# Patient Record
Sex: Male | Born: 1935 | Hispanic: Yes | Marital: Married | Smoking: Former smoker
Health system: Southern US, Community
[De-identification: ages and names within clinical notes are randomized; demographics above are authoritative.]

## PROBLEM LIST (undated history)

## (undated) DIAGNOSIS — G8929 Other chronic pain: Secondary | ICD-10-CM

## (undated) DIAGNOSIS — E785 Hyperlipidemia, unspecified: Secondary | ICD-10-CM

## (undated) DIAGNOSIS — R519 Headache, unspecified: Secondary | ICD-10-CM

## (undated) DIAGNOSIS — K219 Gastro-esophageal reflux disease without esophagitis: Secondary | ICD-10-CM

## (undated) DIAGNOSIS — J189 Pneumonia, unspecified organism: Secondary | ICD-10-CM

## (undated) DIAGNOSIS — K279 Peptic ulcer, site unspecified, unspecified as acute or chronic, without hemorrhage or perforation: Secondary | ICD-10-CM

## (undated) DIAGNOSIS — E119 Type 2 diabetes mellitus without complications: Secondary | ICD-10-CM

## (undated) DIAGNOSIS — C801 Malignant (primary) neoplasm, unspecified: Secondary | ICD-10-CM

## (undated) DIAGNOSIS — M1990 Unspecified osteoarthritis, unspecified site: Secondary | ICD-10-CM

## (undated) DIAGNOSIS — I10 Essential (primary) hypertension: Secondary | ICD-10-CM

## (undated) DIAGNOSIS — D649 Anemia, unspecified: Secondary | ICD-10-CM

## (undated) HISTORY — PX: PEG TUBE REMOVAL: SHX2187

## (undated) HISTORY — PX: ABDOMINAL SURGERY: SHX537

## (undated) HISTORY — DX: Anemia, unspecified: D64.9

## (undated) HISTORY — DX: Other chronic pain: G89.29

## (undated) HISTORY — PX: PEG TUBE PLACEMENT: SUR1034

## (undated) HISTORY — PX: INGUINAL HERNIA REPAIR: SUR1180

## (undated) HISTORY — PX: REPAIR OF PERFORATED ULCER: SHX6065

## (undated) HISTORY — DX: Headache, unspecified: R51.9

## (undated) HISTORY — PX: HERNIA REPAIR: SHX51

## (undated) HISTORY — DX: Essential (primary) hypertension: I10

---

## 2005-12-26 DIAGNOSIS — C801 Malignant (primary) neoplasm, unspecified: Secondary | ICD-10-CM

## 2005-12-26 DIAGNOSIS — C159 Malignant neoplasm of esophagus, unspecified: Secondary | ICD-10-CM

## 2005-12-26 HISTORY — DX: Malignant (primary) neoplasm, unspecified: C80.1

## 2005-12-26 HISTORY — DX: Malignant neoplasm of esophagus, unspecified: C15.9

## 2014-02-17 DIAGNOSIS — D491 Neoplasm of unspecified behavior of respiratory system: Secondary | ICD-10-CM | POA: Diagnosis not present

## 2014-02-17 DIAGNOSIS — M62838 Other muscle spasm: Secondary | ICD-10-CM | POA: Diagnosis not present

## 2014-02-17 DIAGNOSIS — K219 Gastro-esophageal reflux disease without esophagitis: Secondary | ICD-10-CM | POA: Diagnosis not present

## 2014-02-19 DIAGNOSIS — M069 Rheumatoid arthritis, unspecified: Secondary | ICD-10-CM | POA: Diagnosis not present

## 2014-02-19 DIAGNOSIS — M2560 Stiffness of unspecified joint, not elsewhere classified: Secondary | ICD-10-CM | POA: Diagnosis not present

## 2014-03-05 DIAGNOSIS — K219 Gastro-esophageal reflux disease without esophagitis: Secondary | ICD-10-CM | POA: Diagnosis not present

## 2014-03-05 DIAGNOSIS — M62838 Other muscle spasm: Secondary | ICD-10-CM | POA: Diagnosis not present

## 2014-03-05 DIAGNOSIS — D491 Neoplasm of unspecified behavior of respiratory system: Secondary | ICD-10-CM | POA: Diagnosis not present

## 2014-03-12 DIAGNOSIS — D491 Neoplasm of unspecified behavior of respiratory system: Secondary | ICD-10-CM | POA: Diagnosis not present

## 2014-03-12 DIAGNOSIS — M62838 Other muscle spasm: Secondary | ICD-10-CM | POA: Diagnosis not present

## 2014-03-12 DIAGNOSIS — K219 Gastro-esophageal reflux disease without esophagitis: Secondary | ICD-10-CM | POA: Diagnosis not present

## 2014-03-18 DIAGNOSIS — M069 Rheumatoid arthritis, unspecified: Secondary | ICD-10-CM | POA: Diagnosis not present

## 2014-04-16 DIAGNOSIS — M069 Rheumatoid arthritis, unspecified: Secondary | ICD-10-CM | POA: Diagnosis not present

## 2014-04-16 DIAGNOSIS — M2560 Stiffness of unspecified joint, not elsewhere classified: Secondary | ICD-10-CM | POA: Diagnosis not present

## 2014-04-29 DIAGNOSIS — M069 Rheumatoid arthritis, unspecified: Secondary | ICD-10-CM | POA: Diagnosis not present

## 2014-04-29 DIAGNOSIS — M353 Polymyalgia rheumatica: Secondary | ICD-10-CM | POA: Diagnosis not present

## 2014-04-29 DIAGNOSIS — Z8521 Personal history of malignant neoplasm of larynx: Secondary | ICD-10-CM | POA: Diagnosis not present

## 2014-04-29 DIAGNOSIS — D649 Anemia, unspecified: Secondary | ICD-10-CM | POA: Diagnosis not present

## 2014-05-14 DIAGNOSIS — B9789 Other viral agents as the cause of diseases classified elsewhere: Secondary | ICD-10-CM | POA: Diagnosis not present

## 2014-05-16 DIAGNOSIS — R259 Unspecified abnormal involuntary movements: Secondary | ICD-10-CM | POA: Diagnosis not present

## 2014-05-28 DIAGNOSIS — IMO0001 Reserved for inherently not codable concepts without codable children: Secondary | ICD-10-CM | POA: Diagnosis not present

## 2014-05-28 DIAGNOSIS — M353 Polymyalgia rheumatica: Secondary | ICD-10-CM | POA: Diagnosis not present

## 2014-05-28 DIAGNOSIS — Z8521 Personal history of malignant neoplasm of larynx: Secondary | ICD-10-CM | POA: Diagnosis not present

## 2014-06-17 DIAGNOSIS — C320 Malignant neoplasm of glottis: Secondary | ICD-10-CM | POA: Diagnosis not present

## 2014-06-17 DIAGNOSIS — IMO0002 Reserved for concepts with insufficient information to code with codable children: Secondary | ICD-10-CM | POA: Diagnosis not present

## 2014-06-17 DIAGNOSIS — Z79899 Other long term (current) drug therapy: Secondary | ICD-10-CM | POA: Diagnosis not present

## 2014-06-17 DIAGNOSIS — Z09 Encounter for follow-up examination after completed treatment for conditions other than malignant neoplasm: Secondary | ICD-10-CM | POA: Diagnosis not present

## 2014-06-17 DIAGNOSIS — C760 Malignant neoplasm of head, face and neck: Secondary | ICD-10-CM | POA: Diagnosis not present

## 2014-06-17 DIAGNOSIS — Z8521 Personal history of malignant neoplasm of larynx: Secondary | ICD-10-CM | POA: Diagnosis not present

## 2014-06-17 DIAGNOSIS — Z923 Personal history of irradiation: Secondary | ICD-10-CM | POA: Diagnosis not present

## 2014-06-25 DIAGNOSIS — Z8521 Personal history of malignant neoplasm of larynx: Secondary | ICD-10-CM | POA: Diagnosis not present

## 2014-06-25 DIAGNOSIS — C321 Malignant neoplasm of supraglottis: Secondary | ICD-10-CM | POA: Diagnosis not present

## 2014-08-19 DIAGNOSIS — IMO0002 Reserved for concepts with insufficient information to code with codable children: Secondary | ICD-10-CM | POA: Diagnosis not present

## 2014-08-19 DIAGNOSIS — Z8521 Personal history of malignant neoplasm of larynx: Secondary | ICD-10-CM | POA: Diagnosis not present

## 2014-08-19 DIAGNOSIS — N138 Other obstructive and reflux uropathy: Secondary | ICD-10-CM | POA: Diagnosis not present

## 2014-08-19 DIAGNOSIS — E782 Mixed hyperlipidemia: Secondary | ICD-10-CM | POA: Diagnosis not present

## 2014-08-19 DIAGNOSIS — E1169 Type 2 diabetes mellitus with other specified complication: Secondary | ICD-10-CM | POA: Diagnosis not present

## 2014-08-19 DIAGNOSIS — M353 Polymyalgia rheumatica: Secondary | ICD-10-CM | POA: Diagnosis not present

## 2014-08-19 DIAGNOSIS — N401 Enlarged prostate with lower urinary tract symptoms: Secondary | ICD-10-CM | POA: Diagnosis not present

## 2014-08-26 DIAGNOSIS — J069 Acute upper respiratory infection, unspecified: Secondary | ICD-10-CM | POA: Diagnosis not present

## 2014-09-02 DIAGNOSIS — M069 Rheumatoid arthritis, unspecified: Secondary | ICD-10-CM | POA: Diagnosis not present

## 2014-09-04 DIAGNOSIS — E1169 Type 2 diabetes mellitus with other specified complication: Secondary | ICD-10-CM | POA: Diagnosis not present

## 2014-09-04 DIAGNOSIS — E782 Mixed hyperlipidemia: Secondary | ICD-10-CM | POA: Diagnosis not present

## 2014-09-04 DIAGNOSIS — C19 Malignant neoplasm of rectosigmoid junction: Secondary | ICD-10-CM | POA: Diagnosis not present

## 2014-09-04 DIAGNOSIS — M069 Rheumatoid arthritis, unspecified: Secondary | ICD-10-CM | POA: Diagnosis not present

## 2014-09-04 DIAGNOSIS — M2550 Pain in unspecified joint: Secondary | ICD-10-CM | POA: Diagnosis not present

## 2014-09-04 DIAGNOSIS — M329 Systemic lupus erythematosus, unspecified: Secondary | ICD-10-CM | POA: Diagnosis not present

## 2014-09-04 DIAGNOSIS — E039 Hypothyroidism, unspecified: Secondary | ICD-10-CM | POA: Diagnosis not present

## 2014-09-15 DIAGNOSIS — M069 Rheumatoid arthritis, unspecified: Secondary | ICD-10-CM | POA: Diagnosis not present

## 2014-09-15 DIAGNOSIS — M2550 Pain in unspecified joint: Secondary | ICD-10-CM | POA: Diagnosis not present

## 2014-09-18 DIAGNOSIS — R141 Gas pain: Secondary | ICD-10-CM | POA: Diagnosis not present

## 2014-09-18 DIAGNOSIS — R198 Other specified symptoms and signs involving the digestive system and abdomen: Secondary | ICD-10-CM | POA: Diagnosis not present

## 2014-10-01 DIAGNOSIS — E1165 Type 2 diabetes mellitus with hyperglycemia: Secondary | ICD-10-CM | POA: Diagnosis not present

## 2014-10-01 DIAGNOSIS — M069 Rheumatoid arthritis, unspecified: Secondary | ICD-10-CM | POA: Diagnosis not present

## 2014-10-01 DIAGNOSIS — D638 Anemia in other chronic diseases classified elsewhere: Secondary | ICD-10-CM | POA: Diagnosis not present

## 2014-10-01 DIAGNOSIS — N400 Enlarged prostate without lower urinary tract symptoms: Secondary | ICD-10-CM | POA: Diagnosis not present

## 2014-10-02 DIAGNOSIS — M7662 Achilles tendinitis, left leg: Secondary | ICD-10-CM | POA: Diagnosis not present

## 2014-10-02 DIAGNOSIS — B351 Tinea unguium: Secondary | ICD-10-CM | POA: Diagnosis not present

## 2014-10-02 DIAGNOSIS — E1142 Type 2 diabetes mellitus with diabetic polyneuropathy: Secondary | ICD-10-CM | POA: Diagnosis not present

## 2014-10-02 DIAGNOSIS — M79671 Pain in right foot: Secondary | ICD-10-CM | POA: Diagnosis not present

## 2014-10-07 DIAGNOSIS — M79671 Pain in right foot: Secondary | ICD-10-CM | POA: Diagnosis not present

## 2014-10-16 DIAGNOSIS — M79671 Pain in right foot: Secondary | ICD-10-CM | POA: Diagnosis not present

## 2014-10-16 DIAGNOSIS — M779 Enthesopathy, unspecified: Secondary | ICD-10-CM | POA: Diagnosis not present

## 2014-10-16 DIAGNOSIS — E1142 Type 2 diabetes mellitus with diabetic polyneuropathy: Secondary | ICD-10-CM | POA: Diagnosis not present

## 2014-11-03 DIAGNOSIS — M353 Polymyalgia rheumatica: Secondary | ICD-10-CM | POA: Diagnosis not present

## 2014-11-03 DIAGNOSIS — M0609 Rheumatoid arthritis without rheumatoid factor, multiple sites: Secondary | ICD-10-CM | POA: Diagnosis not present

## 2015-01-22 DIAGNOSIS — E119 Type 2 diabetes mellitus without complications: Secondary | ICD-10-CM | POA: Diagnosis not present

## 2015-01-22 DIAGNOSIS — N400 Enlarged prostate without lower urinary tract symptoms: Secondary | ICD-10-CM | POA: Diagnosis not present

## 2015-01-22 DIAGNOSIS — D649 Anemia, unspecified: Secondary | ICD-10-CM | POA: Diagnosis not present

## 2015-01-22 DIAGNOSIS — E785 Hyperlipidemia, unspecified: Secondary | ICD-10-CM | POA: Diagnosis not present

## 2015-01-27 DIAGNOSIS — I340 Nonrheumatic mitral (valve) insufficiency: Secondary | ICD-10-CM | POA: Diagnosis not present

## 2015-01-27 DIAGNOSIS — R0989 Other specified symptoms and signs involving the circulatory and respiratory systems: Secondary | ICD-10-CM | POA: Diagnosis not present

## 2015-01-27 DIAGNOSIS — R9431 Abnormal electrocardiogram [ECG] [EKG]: Secondary | ICD-10-CM | POA: Diagnosis not present

## 2015-01-27 DIAGNOSIS — G458 Other transient cerebral ischemic attacks and related syndromes: Secondary | ICD-10-CM | POA: Diagnosis not present

## 2015-03-06 DIAGNOSIS — J019 Acute sinusitis, unspecified: Secondary | ICD-10-CM | POA: Diagnosis not present

## 2015-03-10 DIAGNOSIS — Z6824 Body mass index (BMI) 24.0-24.9, adult: Secondary | ICD-10-CM | POA: Diagnosis not present

## 2015-03-10 DIAGNOSIS — D638 Anemia in other chronic diseases classified elsewhere: Secondary | ICD-10-CM | POA: Diagnosis not present

## 2015-03-10 DIAGNOSIS — N401 Enlarged prostate with lower urinary tract symptoms: Secondary | ICD-10-CM | POA: Diagnosis not present

## 2015-03-10 DIAGNOSIS — Z8521 Personal history of malignant neoplasm of larynx: Secondary | ICD-10-CM | POA: Diagnosis not present

## 2015-03-10 DIAGNOSIS — M353 Polymyalgia rheumatica: Secondary | ICD-10-CM | POA: Diagnosis not present

## 2015-03-10 DIAGNOSIS — Z87891 Personal history of nicotine dependence: Secondary | ICD-10-CM | POA: Diagnosis not present

## 2015-03-10 DIAGNOSIS — E119 Type 2 diabetes mellitus without complications: Secondary | ICD-10-CM | POA: Diagnosis not present

## 2015-03-25 DIAGNOSIS — M0609 Rheumatoid arthritis without rheumatoid factor, multiple sites: Secondary | ICD-10-CM | POA: Diagnosis not present

## 2015-03-26 DIAGNOSIS — M069 Rheumatoid arthritis, unspecified: Secondary | ICD-10-CM | POA: Diagnosis not present

## 2015-03-26 DIAGNOSIS — R079 Chest pain, unspecified: Secondary | ICD-10-CM | POA: Diagnosis not present

## 2015-03-26 DIAGNOSIS — B159 Hepatitis A without hepatic coma: Secondary | ICD-10-CM | POA: Diagnosis not present

## 2015-05-04 DIAGNOSIS — Z87891 Personal history of nicotine dependence: Secondary | ICD-10-CM | POA: Diagnosis not present

## 2015-05-04 DIAGNOSIS — E119 Type 2 diabetes mellitus without complications: Secondary | ICD-10-CM | POA: Diagnosis not present

## 2015-05-04 DIAGNOSIS — Z8521 Personal history of malignant neoplasm of larynx: Secondary | ICD-10-CM | POA: Diagnosis not present

## 2015-05-04 DIAGNOSIS — Z6824 Body mass index (BMI) 24.0-24.9, adult: Secondary | ICD-10-CM | POA: Diagnosis not present

## 2015-05-04 DIAGNOSIS — E782 Mixed hyperlipidemia: Secondary | ICD-10-CM | POA: Diagnosis not present

## 2015-05-04 DIAGNOSIS — N401 Enlarged prostate with lower urinary tract symptoms: Secondary | ICD-10-CM | POA: Diagnosis not present

## 2015-05-04 DIAGNOSIS — D638 Anemia in other chronic diseases classified elsewhere: Secondary | ICD-10-CM | POA: Diagnosis not present

## 2015-07-09 DIAGNOSIS — M542 Cervicalgia: Secondary | ICD-10-CM | POA: Diagnosis not present

## 2015-07-16 DIAGNOSIS — M542 Cervicalgia: Secondary | ICD-10-CM | POA: Diagnosis not present

## 2015-07-16 DIAGNOSIS — R29898 Other symptoms and signs involving the musculoskeletal system: Secondary | ICD-10-CM | POA: Diagnosis not present

## 2015-08-24 DIAGNOSIS — C321 Malignant neoplasm of supraglottis: Secondary | ICD-10-CM | POA: Diagnosis not present

## 2015-08-24 DIAGNOSIS — Z8521 Personal history of malignant neoplasm of larynx: Secondary | ICD-10-CM | POA: Diagnosis not present

## 2015-09-02 DIAGNOSIS — E119 Type 2 diabetes mellitus without complications: Secondary | ICD-10-CM | POA: Diagnosis not present

## 2015-09-02 DIAGNOSIS — C321 Malignant neoplasm of supraglottis: Secondary | ICD-10-CM | POA: Diagnosis not present

## 2015-09-02 DIAGNOSIS — K5790 Diverticulosis of intestine, part unspecified, without perforation or abscess without bleeding: Secondary | ICD-10-CM | POA: Diagnosis not present

## 2015-09-02 DIAGNOSIS — I2510 Atherosclerotic heart disease of native coronary artery without angina pectoris: Secondary | ICD-10-CM | POA: Diagnosis not present

## 2015-09-02 DIAGNOSIS — R490 Dysphonia: Secondary | ICD-10-CM | POA: Diagnosis not present

## 2015-09-02 DIAGNOSIS — Z8521 Personal history of malignant neoplasm of larynx: Secondary | ICD-10-CM | POA: Diagnosis not present

## 2015-09-02 DIAGNOSIS — J449 Chronic obstructive pulmonary disease, unspecified: Secondary | ICD-10-CM | POA: Diagnosis not present

## 2015-09-02 DIAGNOSIS — Z08 Encounter for follow-up examination after completed treatment for malignant neoplasm: Secondary | ICD-10-CM | POA: Diagnosis not present

## 2015-09-02 DIAGNOSIS — Z79899 Other long term (current) drug therapy: Secondary | ICD-10-CM | POA: Diagnosis not present

## 2015-11-21 ENCOUNTER — Encounter (HOSPITAL_COMMUNITY): Payer: Self-pay | Admitting: Emergency Medicine

## 2015-11-21 ENCOUNTER — Emergency Department (HOSPITAL_COMMUNITY)
Admission: EM | Admit: 2015-11-21 | Discharge: 2015-11-21 | Disposition: A | Discharge status: Home / Self Care | Payer: Medicare Other | Attending: Emergency Medicine | Admitting: Emergency Medicine

## 2015-11-21 ENCOUNTER — Emergency Department (HOSPITAL_COMMUNITY): Payer: Medicare Other

## 2015-11-21 DIAGNOSIS — Z85818 Personal history of malignant neoplasm of other sites of lip, oral cavity, and pharynx: Secondary | ICD-10-CM | POA: Insufficient documentation

## 2015-11-21 DIAGNOSIS — E119 Type 2 diabetes mellitus without complications: Secondary | ICD-10-CM | POA: Diagnosis not present

## 2015-11-21 DIAGNOSIS — M47816 Spondylosis without myelopathy or radiculopathy, lumbar region: Secondary | ICD-10-CM | POA: Diagnosis not present

## 2015-11-21 DIAGNOSIS — M1990 Unspecified osteoarthritis, unspecified site: Secondary | ICD-10-CM | POA: Diagnosis not present

## 2015-11-21 DIAGNOSIS — E785 Hyperlipidemia, unspecified: Secondary | ICD-10-CM | POA: Insufficient documentation

## 2015-11-21 DIAGNOSIS — Z79899 Other long term (current) drug therapy: Secondary | ICD-10-CM | POA: Insufficient documentation

## 2015-11-21 DIAGNOSIS — M545 Low back pain: Secondary | ICD-10-CM | POA: Diagnosis not present

## 2015-11-21 DIAGNOSIS — Z8719 Personal history of other diseases of the digestive system: Secondary | ICD-10-CM | POA: Diagnosis not present

## 2015-11-21 HISTORY — DX: Malignant (primary) neoplasm, unspecified: C80.1

## 2015-11-21 HISTORY — DX: Type 2 diabetes mellitus without complications: E11.9

## 2015-11-21 HISTORY — DX: Unspecified osteoarthritis, unspecified site: M19.90

## 2015-11-21 HISTORY — DX: Gastro-esophageal reflux disease without esophagitis: K21.9

## 2015-11-21 HISTORY — DX: Hyperlipidemia, unspecified: E78.5

## 2015-11-21 LAB — I-STAT CREATININE, ED: Creatinine, Ser: 1.1 mg/dL (ref 0.61–1.24)

## 2015-11-21 LAB — URINALYSIS, ROUTINE W REFLEX MICROSCOPIC
Bilirubin Urine: NEGATIVE
Glucose, UA: 250 mg/dL — AB
Hgb urine dipstick: NEGATIVE
Ketones, ur: NEGATIVE mg/dL
Leukocytes, UA: NEGATIVE
Nitrite: NEGATIVE
Protein, ur: NEGATIVE mg/dL
Specific Gravity, Urine: 1.011 (ref 1.005–1.030)
pH: 5.5 (ref 5.0–8.0)

## 2015-11-21 MED ORDER — TRAMADOL HCL 50 MG PO TABS
50.0000 mg | ORAL_TABLET | Freq: Once | ORAL | Status: AC
  Administered 2015-11-21: 50 mg via ORAL
  Filled 2015-11-21: qty 1

## 2015-11-21 MED ORDER — TRAMADOL HCL 50 MG PO TABS: 50.0000 mg | ORAL_TABLET | Freq: Four times a day (QID) | ORAL | Status: DC | PRN

## 2015-11-21 NOTE — ED Notes (Signed)
Patient c/o low back pain. Has been going on for about 2 months. Patient is bilateral to both sides, it is uncomfortable to sit. Patient daughter states the patient has a hx of bad arthritis. Patient says that he wants to rule out a problem with his kidneys.

## 2015-11-21 NOTE — ED Provider Notes (Signed)
CSN: [id number]     Arrival date & time 11/21/15  0757 History   First MD Initiated Contact with Patient 11/21/15 [phone]     Chief Complaint  Patient presents with  . Back Pain     (Consider location/radiation/quality/duration/timing/severity/associated sxs/prior Treatment) HPI   81 year old male with history of non-insulin-dependent diabetes, arthritis, throat cancer, GERD presenting for evaluation of low back pain. Patient is accompanied by his daughter and son at their request to have evaluation of his back. Patient mentioned he has had recurrent pain to his lower back and bilateral hip ongoing for the past 2 months. He described pain as a sharp sensation, worsening at nighttime and in the morning. Pain is moderate in intensity, nonradiating, without any associated fever, chills, bowel bladder incontinence, or saddle anesthesia. He has not noticed any rash. No complaints of chest pain abdominal pain, dysuria, hematuria, focal numbness or weakness. When asked if he has notice any abnormal weight changes, daughter noticed that he has been losing weight despite having a healthy appetite. He does have a remote history of throat cancer status post chemotherapy and radiation in 2007, currently in remission. He has been taking Tylenol for his pain with our adequate relief. He has not been seen by his PCP for this complaint. He does have history of arthritis. Daughter also request for his kidney function to be checked despite patient not having any significant known kidney disease. No history of IV drug use or active cancer.  Past Medical History  Diagnosis Date  . Arthritis   . Cancer ([place]) 2007    throat  . Diabetes mellitus without complication ([place])   . Hyperlipidemia   . GERD (gastroesophageal reflux disease)    Past Surgical History  Procedure Laterality Date  . Peg tube placement    . Peg tube removal    . Hernia repair     History reviewed. No pertinent family history. Social History   Substance Use Topics  . Smoking status: Never Smoker   . Smokeless tobacco: None  . Alcohol Use: Yes     Comment: occ    Review of Systems  All other systems reviewed and are negative.     Allergies  Review of patient's allergies indicates no known allergies.  Home Medications   Prior to Admission medications   Medication Sig Start Date End Date Taking? Authorizing Provider  cholecalciferol (VITAMIN D) 1000 UNITS tablet Take 2,000 Units by mouth every morning.   Yes Historical Provider, MD  ferrous sulfate 325 (65 FE) MG tablet Take 325 mg by mouth daily with breakfast.   Yes Historical Provider, MD  metFORMIN (GLUCOPHAGE) 1000 MG tablet Take 1,000 mg by mouth 2 (two) times daily with a meal.   Yes Historical Provider, MD  Multiple Vitamin (MULTIVITAMIN WITH MINERALS) TABS tablet Take 1 tablet by mouth every morning.   Yes Historical Provider, MD  simvastatin (ZOCOR) 80 MG tablet Take 80 mg by mouth at bedtime.   Yes Historical Provider, MD  terazosin (HYTRIN) 2 MG capsule Take 2 mg by mouth at bedtime.   Yes Historical Provider, MD   BP 120/64 mmHg  Pulse 86  Temp(Src) 97.8 F (36.6 C) (Oral)  Resp 14  SpO2 96% Physical Exam  Constitutional: He appears well-developed and well-nourished. No distress.  Healthy-appearing Caucasian male in no acute discomfort.  HENT:  Head: Atraumatic.  Eyes: Conjunctivae are normal.  Neck: Neck supple.  Cardiovascular: Normal rate, regular rhythm and intact distal pulses.   Pulmonary/Chest: Effort  normal and breath sounds normal.  Abdominal: Soft. There is no tenderness.  Abdomen is soft and nontender, no palpable mass or abdominal bruit noted  Genitourinary:  No CVA tenderness  Musculoskeletal: He exhibits no edema.  No significant midline spine tenderness crepitus or step-off. No overlying skin changes. Increasing low back pain with back flexion and extension however maintaining normal range of motion.  Neurological: He is alert.  5  out 5 strength to bilateral lower extremities. Patellar deep tendon reflex intact bilaterally. No foot drop. Normal gait.  Skin: No rash noted.  Psychiatric: He has a normal mood and affect.  Nursing note and vitals reviewed.   ED Course  Procedures (including critical care time)  Patient here with recurrent low back pain for 2 months. Likely arthritis. No suspicion for acute emergent medical condition such as aortic dissection, epidural abscess, kidney stones, UTIs, or other infectious etiology.  11:19 AM Urine shows no evidence of urinary tract infection. X-ray of L-spine with disc disease but no acute finding. Normal renal function. Suspect arthralgia causing his pain. Patient will be discharge with Ultram as needed for pain. Care discussed with Dr. Rex Kras who agrees.  Labs Review Labs Reviewed  URINALYSIS, ROUTINE W REFLEX MICROSCOPIC (NOT AT [facility]) - Abnormal; Notable for the following:    Glucose, UA 250 (*)    All other components within normal limits  I-STAT CREATININE, ED    Imaging Review Dg Lumbar Spine Complete  11/21/2015  CLINICAL DATA:  Low back pain 2 months.  No injury. EXAM: LUMBAR SPINE - COMPLETE 4+ VIEW COMPARISON:  None. FINDINGS: Vertebral body alignment and heights are normal. There is mild spondylosis throughout the lumbar spine. There is mild disc space narrowing at the L5-S1 level. No evidence of compression fracture or subluxation. There is facet arthropathy over the lower lumbar spine. Small bone island over the L4 vertebral body. Calcified plaque over the abdominal aorta and iliac arteries. Surgical clips over the upper abdomen and pelvis. IMPRESSION: Mild spondylosis throughout the lumbar spine with disc disease at the L5-S1 level. No acute findings. Electronically Signed   By: Marin Olp M.D.   On: 11/21/2015 09:23   I have personally reviewed and evaluated these images and lab results as part of my medical decision-making.   EKG Interpretation None       MDM   Final diagnoses:  Low back pain without sciatica, unspecified back pain laterality  Arthritis    BP 168/95 mmHg  Pulse 88  Temp(Src) 97.8 F (36.6 C) (Oral)  Resp 16  Wt 66.679 kg  SpO2 96% The patient was noted to be hypertensive today in the emergency department. I have spoken with the patient regarding hypertension and the need for improved management. I instructed the patient to followup with the Primary care doctor within 4 days to improve the management of the patient's hypertension. I also counseled the patient regarding the signs and symptoms which would require an emergent visit to an emergency department for hypertensive urgency and/or hypertensive emergency. The patient understood the need for improved hypertensive management.     Domenic Moras, PA-C 11/21/15 [place], MD 11/21/15 2056

## 2015-11-21 NOTE — Discharge Instructions (Signed)
Your back pain is likely due to arthritis.  Take Ultram as needed for pain.  Follow instruction below.     Artritis (Arthritis) El trmino artritis se [place] comnmente para hacer referencia al dolor de las articulaciones o a la enfermedad articular. Hay ms de 100tipos de artritis. CAUSAS La causa ms frecuente de esta afeccin es el desgaste de una articulacin. Algunas otras causas son las siguientes:  Gota.  Inflamacin de una articulacin.  Una infeccin de Insurance claims handler.  Esguinces y otras lesiones cerca de la articulacin.  Una reaccin farmacolgica o alrgica. En algunos casos, es posible que la causa no se conozca. SNTOMAS El sntoma principal de esta afeccin es el dolor de la articulacin con el movimiento. Otros sntomas pueden ser los siguientes:  Enrojecimiento, hinchazn o rigidez de Insurance claims handler.  Calor que emana de Water engineer.  Cristy Hilts.  Sensacin generalizada de estar enfermo. DIAGNSTICO Esta afeccin se puede diagnosticar mediante un examen fsico y Arvid Right ellos:  Anlisis de [place].  Anlisis de [place].  Estudios de diagnstico por imgenes, como una resonancia magntica (RM), radiografas o una tomografa computarizada (TC). A veces, se extrae lquido de una articulacin para analizarlo. TRATAMIENTO El tratamiento de esta afeccin puede incluir lo siguiente:  El tratamiento de la causa, si se conoce.  Reposo.  Mantener elevada la articulacin.  Aplicar compresas fras o calientes en la articulacin.  Medicamentos para [employer] sntomas y Risk manager.  Inyecciones de un corticoide, como cortisona, en la articulacin para ayudar a Best boy y Risk manager. Segn la causa de la artritis, tal vez haya que hacer cambios en el estilo de vida para reducir la carga sobre la articulacin. Algunos de los cambios incluyen realizar ms actividad fsica y Sports coach de [place], [place]. Ezel los medicamentos de venta libre y los recetados solamente como se lo haya indicado el mdico.  No tome aspirina para Theatre stage manager dolor si se sospecha la presencia de [place]. Actividades  Ponga en reposo la articulacin como se lo haya indicado el mdico. El reposo es importante cuando la enfermedad est activa y la articulacin le duele, est hinchada o rgida.  Evite las actividades que intensifiquen Conservation officer, historic buildings. Es importante Paramedic equilibrio entre la actividad y el reposo.  Con frecuencia, realice ejercicios de flexibilidad articular como se lo haya indicado el mdico. Intente realizar ejercicios de bajo impacto, por ejemplo:  Natacin.  Gimnasia acutica.  Andar en bicicleta.  Caminar. Cuidado de la articulacin  Si la articulacin se le hincha, mantngala elevada como se lo haya indicado el mdico.  Si al despertar por la maana, nota que la articulacin est rgida, intente tomar una ducha con agua tibia.  Si se lo indican, pngase calor en la articulacin. Si es diabtico, no se aplique calor sin la autorizacin del mdico.  Coloque una toalla entre la articulacin y la compresa caliente o la almohadilla trmica.  Coloque el calor en la zona durante 20 o 67mnutos.  Si se lo indican, pngase hielo en la articulacin:  Ponga el hielo en una bolsa plstica.  Coloque una toalla entre la piel y la bolsa de hielo.  Coloque el hielo durante 257mutos, 2 a 3veces por daTraining and development officer Concurra a todas las visitas de control como se lo haya indicado el mdico. Esto es importante. SOLICITE ATENCIN MDICA SI:  El dolor empeora.  Tiene fiebre. SOLICITE ATENCIN MDICA DE INMEDIATO SI:  Siente  dolor, u observa hinchazn o enrojecimiento en la articulacin.  Siente dolor en muchas articulaciones y se le hinchan.  Siente un dolor intenso en la espalda.  Tiene mucha debilidad en la pierna.  No puede controlar los intestinos o la  vejiga.   Esta informacin no tiene Marine scientist el consejo del mdico. Asegrese de hacerle al mdico cualquier pregunta que tenga.   Document Released: 12/12/2005 Document Revised: 09/02/2015 Elsevier Interactive Patient Education 2016 [place]. Back Pain, Adult Back pain is very common in adults.The cause of back pain is rarely dangerous and the pain often gets better over time.The cause of your back pain may not be known. Some common causes of back pain include:  Strain of the muscles or ligaments supporting the spine.  Wear and tear (degeneration) of the spinal disks.  Arthritis.  Direct injury to the back. For many people, back pain may return. Since back pain is rarely dangerous, most people can learn to manage this condition on their own. HOME CARE INSTRUCTIONS Watch your back pain for any changes. The following actions may help to lessen any discomfort you are feeling:  Remain active. It is stressful on your back to sit or stand in one place for long periods of time. Do not sit, drive, or stand in one place for more than 30 minutes at a time. Take short walks on even surfaces as soon as you are able.Try to increase the length of time you walk each day.  Exercise regularly as directed by your health care provider. Exercise helps your back heal faster. It also helps avoid future injury by keeping your muscles strong and flexible.  Do not stay in bed.Resting more than 1-2 days can delay your recovery.  Pay attention to your body when you bend and lift. The most comfortable positions are those that put less stress on your recovering back. Always use proper lifting techniques, including:  Bending your knees.  Keeping the load close to your body.  Avoiding twisting.  Find a comfortable position to sleep. Use a firm mattress and lie on your side with your knees slightly bent. If you lie on your back, put a pillow under your knees.  Avoid feeling anxious or  stressed.Stress increases muscle tension and can worsen back pain.It is important to recognize when you are anxious or stressed and learn ways to manage it, such as with exercise.  Take medicines only as directed by your health care provider. Over-the-counter medicines to reduce pain and inflammation are often the most helpful.Your health care provider may prescribe muscle relaxant drugs.These medicines help dull your pain so you can more quickly return to your normal activities and healthy exercise.  Apply ice to the injured area:  Put ice in a plastic bag.  Place a towel between your skin and the bag.  Leave the ice on for 20 minutes, 2-3 times a day for the first 2-3 days. After that, ice and heat may be alternated to reduce pain and spasms.  Maintain a healthy weight. Excess weight puts extra stress on your back and makes it difficult to maintain good posture. SEEK MEDICAL CARE IF:  You have pain that is not relieved with rest or medicine.  You have increasing pain going down into the legs or buttocks.  You have pain that does not improve in one week.  You have night pain.  You lose weight.  You have a fever or chills. SEEK IMMEDIATE MEDICAL CARE IF:   You develop  new bowel or bladder control problems.  You have unusual weakness or numbness in your arms or legs.  You develop nausea or vomiting.  You develop abdominal pain.  You feel faint.   This information is not intended to replace advice given to you by your health care provider. Make sure you discuss any questions you have with your health care provider.   Document Released: 12/12/2005 Document Revised: 01/02/2015 Document Reviewed: 04/15/2014 Elsevier Interactive Patient Education 2016 [employer].   Emergency Department Resource Guide 1) Find a Doctor and Pay Out of Pocket Although you won't have to find out who is covered by your insurance plan, it is a good idea to ask around and get recommendations.  You will then need to call the office and see if the doctor you have chosen will accept you as a new patient and what types of options they offer for patients who are self-pay. Some doctors offer discounts or will set up payment plans for their patients who do not have insurance, but you will need to ask so you aren't surprised when you get to your appointment.  2) Contact Your Local Health Department Not all health departments have doctors that can see patients for sick visits, but many do, so it is worth a call to see if yours does. If you don't know where your local health department is, you can check in your phone book. The CDC also has a tool to help you locate your state's health department, and many state websites also have listings of all of their local health departments.  3) Find a [facility] If your illness is not likely to be very severe or complicated, you may want to try a walk in clinic. These are popping up all over the country in pharmacies, drugstores, and shopping centers. They're usually staffed by nurse practitioners or physician assistants that have been trained to treat common illnesses and complaints. They're usually fairly quick and inexpensive. However, if you have serious medical issues or chronic medical problems, these are probably not your best option.  No Primary Care Doctor: - Call Health Connect at  [phone] - they can help you locate a primary care doctor that  accepts your insurance, provides certain services, etc. - Physician Referral Service- [phone]  Chronic Pain Problems: Organization         Address  Phone   Notes  [facility]  [phone] Patients need to be referred by their primary care doctor.   Medication Assistance: Organization         Address  Phone   Notes  [facility] Medication [facility] [address] [place], [place] [address] [phone] --Must be a resident of [facility] --  Must have NO insurance coverage whatsoever (no Medicaid/ Medicare, etc.) -- The pt. MUST have a primary care doctor that directs their care regularly and follows them in the community   MedAssist  [phone]   [employer]  [phone]    Agencies that provide inexpensive medical care: Organization         Address  Phone   Notes  [place]  [phone]   [facility]    [phone]   [facility] [place], [place] [address] [phone]   [facility] [address], [place] [phone]   Planned Parenthood    [phone]   [place]  [facility]    [phone]   [facility]  [address], [place] Phone:  [phone], Fax:  [phone] Hours of Operation:  9 am - 6 pm, M-F.  Also accepts Medicaid/Medicare and self-pay.  [facility] for [address], [address], [place] Phone: [phone], Fax: [phone]. Hours of Operation:  8:30 am - 5:30 pm, M-F.  Also accepts Medicaid and self-pay.  [facility] [place] [address], [place] Phone: [phone]   [place], [place], [place] [phone], Ext. [phone] Mondays & Thursdays: 7-9 AM.  First 15 patients are seen on a first come, first serve basis.    [place] Providers:  Organization         Address  Phone   Notes  [facility] [address], [place] [phone] Also accepts self-pay patients.  [facility] [address], [place]  [phone]   [place], [address], [place] [phone]   [facility] [address] [place] [phone]   Lucianne Lei [address], [place]   [phone] Only accepts [place] Access [place] patients  after they have their name applied to their card.   Self-Pay (no insurance) in [facility]:  Organization         Address  Phone   Notes  Sickle Cell Patients, [facility] Internal Medicine [place] [phone]   [facility] [place] [phone]   [facility] [place]  [place], [place],  [phone]   [facility]/Dr. Osei-Bonsu  [address], [place] or [address], [place] [phone] Phone number for both [place] and [place] locations is the same.  Urgent Medical and [facility] [address] [place] [phone]   [facility] [address], [place] or [address] [phone]   [facility] [address], [place] [phone], phone; [phone], fax Sees patients 1st and 3rd Saturday of every month.  Must not qualify for public or private insurance (i.e. Medicaid, Medicare, [place] Health Choice, Veterans' Benefits)  Household income should be no more than 200% of the poverty level The clinic cannot treat you if you are pregnant or think you are pregnant  Sexually transmitted diseases are not treated at the clinic.    Dental Care: Organization         Address  Phone  Notes  [facility] Department of [facility] [place] [phone] Accepts children up to age 46 who are enrolled in [place] or [place]; pregnant women with a Medicaid card; and children who have applied for Medicaid or [place] Health Choice, but were declined, whose parents can pay a reduced fee at time of service.  [facility] of [facility]  [address], [place] [phone] Accepts children up to age 84 who are enrolled in [place] or [place]; pregnant women with a Medicaid card; and children  who have applied for Medicaid or [place] Health Choice, but were declined, whose parents can pay a reduced fee at time of  service.  [place] Adult Dental Access PROGRAM  [place] [phone] Patients are seen by appointment only. Walk-ins are not accepted. [place] will see patients 46 years of age and older. Monday - Tuesday (8am-5pm) Most Wednesdays (8:30-5pm) $30 per visit, cash only  [facility] Adult Dental Access PROGRAM  [address], [facility] [phone] Patients are seen by appointment only. Walk-ins are not accepted. [place] will see patients 89 years of age and older. One Wednesday Evening (Monthly: Volunteer Based).  $30 per visit, cash only  [place]  [phone] for adults; Children under age 16, call Graduate Pediatric Dentistry at [phone]. Children aged 51-14, please call [phone] to request a pediatric application.  Dental services are provided in all areas of dental care including fillings, crowns and bridges, complete and partial dentures, implants, gum treatment, root canals, and extractions. Preventive care is also provided. Treatment is provided to both adults and children. Patients are selected via a lottery and there is often a waiting list.   [facility] [address], [place]  [phone] [email or web address]   [facility] [address] [place], [place] [phone], Ext. [phone] Second and Fourth Thursday of each month, opens at 6:30 AM; Clinic ends at 9 AM.  Patients are seen on a first-come first-served basis, and a limited number are seen during each clinic.   [facility]  [address] Hillard Danker [place], [place] [phone]   Eligibility Requirements You must have lived in [place], [place], or [place] counties for at least the last three months.   You cannot be eligible for state or federal sponsored [employer], including [employer], [place], or [employer].   You generally cannot be eligible for healthcare insurance through your employer.    How to apply: Eligibility screenings are held every Tuesday and Wednesday afternoon from 1:00 pm until 4:00 pm. You do not need an appointment for the interview!  [facility] [address], [place], [place]   [place]  [place] Department  [place]  [phone]    Behavioral Health Resources in the Community: Intensive Outpatient Programs Organization         Address  Phone  Notes  Charlotte Polvadera. [address] [place], [place] [phone]   [facility]patient [address] [place], [place]   ADS: Alcohol & Drug Svcs [address], [place], [place]   [place] [address]  [place], [place] or [phone]   Substance Abuse Resources Organization         Address  Phone  Notes  Alcohol and Drug Services  [phone]   [place]  [phone]   [facility] [employer]   Chinita Pester  [phone]   Residential & Outpatient Substance Abuse Program  [phone]   Psychological Services Organization         Address  Phone  Notes  [facility] [place]  [place]  [phone]   [place] [address] [place] [phone] or [phone]    Mobile Crisis Teams Organization         Address  Phone  Notes  [facility], Mobile Crisis Care Unit  [phone]   Assertive Psychotherapeutic Services  [place], [place]   [facility] [address] [place] [phone]    Self-Help/Support Groups Organization         Address  Phone             Notes  [place]. of [place] -  variety of support groups  [place] Call for more information  Narcotics Anonymous (NA), [facility] [address], [employer] [place]  2 meetings at this location   Special educational needs teacher         Address  Phone  Notes  [facility]AP Residential Treatment [place],    [place]  [phone]   [facility]  [address] [place] [phone], [place], [place]   [place], [place] [phone] Admissions: 8am-3pm M-F  Incentives Substance [place] [address]    [place], [place] [phone]   [facility] [address] [place], [place], [place]   [facility] [address],  [place], [place]   [facility] - Intensive Outpatient [address] Kristeen [address], [place], [place]   [facility] [place].,  [place], [place] [phone] or [phone]   [facility]idential Treatment Services (RTS) [address] [place], [place] Accepts Medicaid  [facility] [place] [address]  [place] [phone] Substance Abuse/Addiction Treatment   [facility] Organization         Address  Phone  Notes  [facility]  [phone]   Domenic Schwab, PhD [address] Arlis [place], [place]   [phone] or [phone]   [place], [place] [phone]   [facility] [address] [place], [place] [phone] Insurance/Medicaid/sponsorship through [facility] and Families [address]                                    [place], [place] [phone] [place] [address], [place] [phone]    Dr. Adele Schilder  [phone]   [facility] Dept. 1) [address], [place] 2) [place] 3)  [place] [address], [place] [phone]  [phone]   [place] [phone] or [phone] (After Hours)

## 2015-12-20 DIAGNOSIS — G8929 Other chronic pain: Secondary | ICD-10-CM | POA: Diagnosis not present

## 2015-12-20 DIAGNOSIS — N400 Enlarged prostate without lower urinary tract symptoms: Secondary | ICD-10-CM | POA: Diagnosis not present

## 2015-12-20 DIAGNOSIS — Z8512 Personal history of malignant neoplasm of trachea: Secondary | ICD-10-CM | POA: Diagnosis not present

## 2015-12-20 DIAGNOSIS — M47812 Spondylosis without myelopathy or radiculopathy, cervical region: Secondary | ICD-10-CM | POA: Diagnosis not present

## 2015-12-20 DIAGNOSIS — M542 Cervicalgia: Secondary | ICD-10-CM | POA: Diagnosis not present

## 2015-12-20 DIAGNOSIS — E119 Type 2 diabetes mellitus without complications: Secondary | ICD-10-CM | POA: Diagnosis not present

## 2015-12-23 DIAGNOSIS — M542 Cervicalgia: Secondary | ICD-10-CM | POA: Diagnosis not present

## 2015-12-27 DIAGNOSIS — J069 Acute upper respiratory infection, unspecified: Secondary | ICD-10-CM | POA: Diagnosis not present

## 2016-09-01 DIAGNOSIS — Z85818 Personal history of malignant neoplasm of other sites of lip, oral cavity, and pharynx: Secondary | ICD-10-CM | POA: Diagnosis not present

## 2016-09-01 DIAGNOSIS — E119 Type 2 diabetes mellitus without complications: Secondary | ICD-10-CM | POA: Diagnosis not present

## 2016-09-01 DIAGNOSIS — I499 Cardiac arrhythmia, unspecified: Secondary | ICD-10-CM | POA: Diagnosis not present

## 2016-09-01 DIAGNOSIS — J449 Chronic obstructive pulmonary disease, unspecified: Secondary | ICD-10-CM | POA: Diagnosis not present

## 2016-09-06 DIAGNOSIS — C321 Malignant neoplasm of supraglottis: Secondary | ICD-10-CM | POA: Diagnosis not present

## 2016-09-06 DIAGNOSIS — E119 Type 2 diabetes mellitus without complications: Secondary | ICD-10-CM | POA: Diagnosis not present

## 2016-09-06 DIAGNOSIS — Z8521 Personal history of malignant neoplasm of larynx: Secondary | ICD-10-CM | POA: Diagnosis not present

## 2016-09-06 DIAGNOSIS — Z7984 Long term (current) use of oral hypoglycemic drugs: Secondary | ICD-10-CM | POA: Diagnosis not present

## 2016-09-06 DIAGNOSIS — Z08 Encounter for follow-up examination after completed treatment for malignant neoplasm: Secondary | ICD-10-CM | POA: Diagnosis not present

## 2016-10-01 ENCOUNTER — Encounter (HOSPITAL_COMMUNITY): Payer: Self-pay

## 2016-10-01 ENCOUNTER — Emergency Department (HOSPITAL_COMMUNITY): Payer: Medicare Other

## 2016-10-01 ENCOUNTER — Emergency Department (HOSPITAL_COMMUNITY)
Admission: EM | Admit: 2016-10-01 | Discharge: 2016-10-01 | Disposition: A | Discharge status: Home / Self Care | Payer: Medicare Other | Attending: Emergency Medicine | Admitting: Emergency Medicine

## 2016-10-01 DIAGNOSIS — R072 Precordial pain: Secondary | ICD-10-CM | POA: Diagnosis present

## 2016-10-01 DIAGNOSIS — R079 Chest pain, unspecified: Secondary | ICD-10-CM

## 2016-10-01 DIAGNOSIS — Z7984 Long term (current) use of oral hypoglycemic drugs: Secondary | ICD-10-CM | POA: Diagnosis not present

## 2016-10-01 DIAGNOSIS — K279 Peptic ulcer, site unspecified, unspecified as acute or chronic, without hemorrhage or perforation: Secondary | ICD-10-CM | POA: Diagnosis not present

## 2016-10-01 DIAGNOSIS — Z85819 Personal history of malignant neoplasm of unspecified site of lip, oral cavity, and pharynx: Secondary | ICD-10-CM | POA: Insufficient documentation

## 2016-10-01 DIAGNOSIS — E119 Type 2 diabetes mellitus without complications: Secondary | ICD-10-CM | POA: Diagnosis not present

## 2016-10-01 HISTORY — DX: Peptic ulcer, site unspecified, unspecified as acute or chronic, without hemorrhage or perforation: K27.9

## 2016-10-01 LAB — I-STAT TROPONIN, ED
Troponin i, poc: 0.01 ng/mL (ref 0.00–0.08)
Troponin i, poc: 0.02 ng/mL (ref 0.00–0.08)

## 2016-10-01 LAB — CBC
HCT: 37.5 % — ABNORMAL LOW (ref 39.0–52.0)
Hemoglobin: 12.6 g/dL — ABNORMAL LOW (ref 13.0–17.0)
MCH: 29.8 pg (ref 26.0–34.0)
MCHC: 33.6 g/dL (ref 30.0–36.0)
MCV: 88.7 fL (ref 78.0–100.0)
Platelets: 145 10*3/uL — ABNORMAL LOW (ref 150–400)
RBC: 4.23 MIL/uL (ref 4.22–5.81)
RDW: 13.3 % (ref 11.5–15.5)
WBC: 3.7 10*3/uL — ABNORMAL LOW (ref 4.0–10.5)

## 2016-10-01 LAB — BASIC METABOLIC PANEL
Anion gap: 5 (ref 5–15)
BUN: 19 mg/dL (ref 6–20)
CO2: 26 mmol/L (ref 22–32)
Calcium: 9.3 mg/dL (ref 8.9–10.3)
Chloride: 105 mmol/L (ref 101–111)
Creatinine, Ser: 1.21 mg/dL (ref 0.61–1.24)
GFR calc Af Amer: >60 mL/min (ref >60)
GFR calc non Af Amer: 55 mL/min — ABNORMAL LOW (ref >60)
Glucose, Bld: 196 mg/dL — ABNORMAL HIGH (ref 65–99)
Potassium: 4.3 mmol/L (ref 3.5–5.1)
Sodium: 136 mmol/L (ref 135–145)

## 2016-10-01 NOTE — ED Triage Notes (Signed)
He reports feeling mid-chest "burning" which he "has never felt before". This was accompanied by some diaphoresis, which has now resolved. EKG performed at triage.

## 2016-10-01 NOTE — ED Provider Notes (Signed)
[facility] DEPT Provider Note   CSN: [id number] Arrival date & time: 10/01/16  1130     History   Chief Complaint Chief Complaint  Patient presents with  . Chest Pain    HPI Raymond Grant is a 82 y.o. male.   Chest Pain   This is a new problem. The current episode started 1 to 2 hours ago. The problem occurs rarely. The problem has been resolved. The pain is associated with eating. The pain is present in the substernal region. The pain is moderate. The quality of the pain is described as burning. The pain does not radiate. Duration of episode(s) is 15 seconds. Associated symptoms include abdominal pain and nausea. Pertinent negatives include no cough, no fever and no shortness of breath.    Past Medical History:  Diagnosis Date  . Arthritis   . Cancer ([place]) 2007   throat  . Diabetes mellitus without complication ([place])   . GERD (gastroesophageal reflux disease)   . Hyperlipidemia   . Peptic ulcer disease     Patient Active Problem List   Diagnosis Date Noted  . Peptic ulcer disease     Past Surgical History:  Procedure Laterality Date  . ABDOMINAL SURGERY    . HERNIA REPAIR    . PEG TUBE PLACEMENT    . PEG TUBE REMOVAL    . REPAIR OF PERFORATED ULCER         Home Medications    Prior to Admission medications   Medication Sig Start Date End Date Taking? Authorizing Provider  cholecalciferol (VITAMIN D) 1000 UNITS tablet Take 2,000 Units by mouth every morning.    Historical Provider, MD  ferrous sulfate 325 (65 FE) MG tablet Take 325 mg by mouth daily with breakfast.    Historical Provider, MD  metFORMIN (GLUCOPHAGE) 1000 MG tablet Take 1,000 mg by mouth 2 (two) times daily with a meal.    Historical Provider, MD  Multiple Vitamin (MULTIVITAMIN WITH MINERALS) TABS tablet Take 1 tablet by mouth every morning.    Historical Provider, MD  simvastatin (ZOCOR) 80 MG tablet Take 80 mg by mouth at bedtime.    Historical Provider, MD  terazosin (HYTRIN) 2 MG  capsule Take 2 mg by mouth at bedtime.    Historical Provider, MD  traMADol (ULTRAM) 50 MG tablet Take 1 tablet (50 mg total) by mouth every 6 (six) hours as needed for severe pain. 11/21/15   Domenic Moras, PA-C    Family History No family history on file.  Social History Social History  Substance Use Topics  . Smoking status: Never Smoker  . Smokeless tobacco: Not on file  . Alcohol use No     Comment: former drinker     Allergies   Review of patient's allergies indicates no known allergies.   Review of Systems Review of Systems  Constitutional: Negative for fever.  Respiratory: Negative for cough and shortness of breath.   Cardiovascular: Positive for chest pain.  Gastrointestinal: Positive for abdominal pain and nausea.  All other systems reviewed and are negative.    Physical Exam Updated Vital Signs BP 168/89   Pulse 75   Temp 97.5 F (36.4 C) (Oral)   Resp 16   Ht '5\' 6"'$  (1.676 m)   Wt 150 lb (68 kg)   SpO2 95%   BMI 24.21 kg/m   Physical Exam  Constitutional: He appears well-developed and well-nourished.  HENT:  Head: Normocephalic and atraumatic.  Eyes: Conjunctivae are normal.  Neck: Neck supple.  Cardiovascular: Normal rate and regular rhythm.   No murmur heard. Pulmonary/Chest: Effort normal and breath sounds normal. No respiratory distress.  Abdominal: Soft. There is no tenderness.  Musculoskeletal: Normal range of motion. He exhibits no edema or deformity.  Neurological: He is alert.  Skin: Skin is warm and dry. No rash noted.  Psychiatric: He has a normal mood and affect.  Nursing note and vitals reviewed.    ED Treatments / Results  Labs (all labs ordered are listed, but only abnormal results are displayed) Labs Reviewed  BASIC METABOLIC PANEL - Abnormal; Notable for the following:       Result Value   Glucose, Bld 196 (*)    GFR calc non Af Amer 55 (*)    All other components within normal limits  CBC - Abnormal; Notable for the  following:    WBC 3.7 (*)    Hemoglobin 12.6 (*)    HCT 37.5 (*)    Platelets 145 (*)    All other components within normal limits  I-STAT TROPOININ, ED  I-STAT TROPOININ, ED    EKG  EKG Interpretation  Date/Time:  Saturday October 01 2016 11:34:52 EDT Ventricular Rate:  91 PR Interval:    QRS Duration: 95 QT Interval:  353 QTC Calculation: 435 R Axis:   83 Text Interpretation:  Sinus rhythm Ventricular premature complex Borderline right axis deviation Confirmed by [facility] MD, Corene Cornea [id number]) on 10/01/2016 1:21:10 PM       Radiology Dg Chest 2 View  Result Date: 10/01/2016 CLINICAL DATA:  Chest pain. EXAM: CHEST  2 VIEW COMPARISON:  None. FINDINGS: The heart size and mediastinal contours are within normal limits. Scattered parenchymal scarring present. There is no evidence of pulmonary edema, consolidation, pneumothorax, nodule or pleural fluid. The thoracic spine demonstrates mild spondylosis. IMPRESSION: No active cardiopulmonary disease. Electronically Signed   By: Aletta Edouard M.D.   On: 10/01/2016 11:55    Procedures Procedures (including critical care time)  Medications Ordered in ED Medications - No data to display   Initial Impression / Assessment and Plan / ED Course  I have reviewed the triage vital signs and the nursing notes.  Pertinent labs & imaging results that were available during my care of the patient were reviewed by me and considered in my medical decision making (see chart for details).  Clinical Course    Likely reflux. However HEART of 3 so delta troponins done. ecg normal (compared to ECG on patient's son phone) and unchanged from previous. Doubt ACS.  Normal pulses, no wide mediastinum, no continuous pain so I doubt dissection.  Low risk for other emergent causes of symptoms.   Final Clinical Impressions(s) / ED Diagnoses   Final diagnoses:  Peptic ulcer disease  Chest pain, unspecified type    New Prescriptions Discharge Medication  List as of 10/01/2016  3:31 PM       Merrily Pew, MD 10/01/16 1813

## 2017-05-05 ENCOUNTER — Emergency Department (HOSPITAL_COMMUNITY)
Admission: EM | Admit: 2017-05-05 | Discharge: 2017-05-05 | Disposition: A | Discharge status: Home / Self Care | Payer: Medicare Other | Attending: Emergency Medicine | Admitting: Emergency Medicine

## 2017-05-05 ENCOUNTER — Encounter (HOSPITAL_COMMUNITY): Payer: Self-pay | Admitting: Emergency Medicine

## 2017-05-05 DIAGNOSIS — Z5321 Procedure and treatment not carried out due to patient leaving prior to being seen by health care provider: Secondary | ICD-10-CM | POA: Insufficient documentation

## 2017-05-05 DIAGNOSIS — M79672 Pain in left foot: Secondary | ICD-10-CM | POA: Diagnosis not present

## 2017-05-05 NOTE — ED Notes (Signed)
Called Pt to be roomed in lobby no response, do not see patient in lobby

## 2017-05-05 NOTE — ED Triage Notes (Signed)
Pt complaint of left heel pain worsening over "some time;" denies injury. Ambulatory with limp.

## 2017-05-20 ENCOUNTER — Emergency Department (HOSPITAL_COMMUNITY)
Admission: EM | Admit: 2017-05-20 | Discharge: 2017-05-21 | Disposition: A | Discharge status: Home / Self Care | Payer: Medicare Other | Attending: Emergency Medicine | Admitting: Emergency Medicine

## 2017-05-20 ENCOUNTER — Encounter (HOSPITAL_COMMUNITY): Payer: Self-pay | Admitting: Emergency Medicine

## 2017-05-20 DIAGNOSIS — E119 Type 2 diabetes mellitus without complications: Secondary | ICD-10-CM | POA: Diagnosis not present

## 2017-05-20 DIAGNOSIS — Z7984 Long term (current) use of oral hypoglycemic drugs: Secondary | ICD-10-CM | POA: Diagnosis not present

## 2017-05-20 DIAGNOSIS — M79672 Pain in left foot: Secondary | ICD-10-CM | POA: Insufficient documentation

## 2017-05-20 DIAGNOSIS — Z79899 Other long term (current) drug therapy: Secondary | ICD-10-CM | POA: Diagnosis not present

## 2017-05-20 LAB — CBG MONITORING, ED: Glucose-Capillary: 167 mg/dL — ABNORMAL HIGH (ref 65–99)

## 2017-05-20 NOTE — ED Triage Notes (Signed)
Patient c/o of left heel pain and redness worsening. States been dealing with this pain for months, but pain is now keeping him from sleeping. Pt ambulatory with limp.

## 2017-05-21 ENCOUNTER — Emergency Department (HOSPITAL_COMMUNITY): Payer: Medicare Other

## 2017-05-21 DIAGNOSIS — M79672 Pain in left foot: Secondary | ICD-10-CM | POA: Diagnosis not present

## 2017-05-21 MED ORDER — IBUPROFEN 200 MG PO TABS
400.0000 mg | ORAL_TABLET | Freq: Once | ORAL | Status: AC
  Administered 2017-05-21: 400 mg via ORAL
  Filled 2017-05-21: qty 2

## 2017-05-21 MED ORDER — IBUPROFEN 400 MG PO TABS: 400.0000 mg | ORAL_TABLET | Freq: Four times a day (QID) | ORAL | 0 refills | Status: DC | PRN

## 2017-05-21 NOTE — ED Provider Notes (Signed)
[facility] DEPT Provider Note   CSN: [id number] Arrival date & time: 05/20/17  2026   By signing my name below, I, Eunice Blase, attest that this documentation has been prepared under the direction and in the presence of Varney Biles, MD. Electronically signed, Eunice Blase, ED Scribe. 05/21/17. 12:28 AM.   History   Chief Complaint Chief Complaint  Patient presents with  . Foot Pain   The history is provided by the patient and medical records. No language interpreter was used.    Raymond Grant is a 83 y.o. male with h/o arthritis, DM without complications and CA who presents to the Emergency Department with concern for new, gradually worsening L heel pain x 2 months. He is unsure what may have caused this. No h/o similar symptoms noted. He describes 9/10, sharp pain worse with extension/ flexion of the ankle and walking. He notes no attempted treatments at home. No other modifying factors noted. Pt followed at the [facility] for primary care; he states he has informed his primary care physician though the provider was not concerned about this at the time. No trauma noted. No other complaints at this time.   Past Medical History:  Diagnosis Date  . Arthritis   . Cancer ([place]) 2007   throat  . Diabetes mellitus without complication ([place])   . GERD (gastroesophageal reflux disease)   . Hyperlipidemia   . Peptic ulcer disease     Patient Active Problem List   Diagnosis Date Noted  . Peptic ulcer disease     Past Surgical History:  Procedure Laterality Date  . ABDOMINAL SURGERY    . HERNIA REPAIR    . PEG TUBE PLACEMENT    . PEG TUBE REMOVAL    . REPAIR OF PERFORATED ULCER         Home Medications    Prior to Admission medications   Medication Sig Start Date End Date Taking? Authorizing Provider  cholecalciferol (VITAMIN D) 1000 UNITS tablet Take 2,000 Units by mouth every morning.    [provider]  ferrous sulfate 325 (65 FE) MG tablet Take 325 mg by mouth  daily with breakfast.    [provider]  ibuprofen (ADVIL,MOTRIN) 400 MG tablet Take 1 tablet (400 mg total) by mouth every 6 (six) hours as needed. 05/21/17   Varney Biles, MD  metFORMIN (GLUCOPHAGE) 1000 MG tablet Take 1,000 mg by mouth 2 (two) times daily with a meal.    [provider]  Multiple Vitamin (MULTIVITAMIN WITH MINERALS) TABS tablet Take 1 tablet by mouth every morning.    [provider]  simvastatin (ZOCOR) 80 MG tablet Take 80 mg by mouth at bedtime.    [provider]  terazosin (HYTRIN) 2 MG capsule Take 2 mg by mouth at bedtime.    [provider]  traMADol (ULTRAM) 50 MG tablet Take 1 tablet (50 mg total) by mouth every 6 (six) hours as needed for severe pain. 11/21/15   Domenic Moras, PA-C    Family History No family history on file.  Social History Social History  Substance Use Topics  . Smoking status: Never Smoker  . Smokeless tobacco: Not on file  . Alcohol use No     Comment: former drinker     Allergies   Patient has no known allergies.   Review of Systems Review of Systems  Musculoskeletal: Positive for arthralgias. Negative for joint swelling.  Skin: Positive for color change. Negative for wound.  Neurological: Negative for weakness and  numbness.  All other systems reviewed and are negative.    Physical Exam Updated Vital Signs BP (!) 174/96 (BP Location: Left Arm)   Pulse 74   Temp 98.7 F (37.1 C) (Oral)   Resp 18   Ht 5\' 6"  (1.676 m)   Wt 156 lb (70.8 kg)   SpO2 97%   BMI 25.18 kg/m   Physical Exam  Constitutional: He is oriented to person, place, and time. He appears well-developed and well-nourished.  HENT:  Head: Normocephalic.  Eyes: EOM are normal.  Neck: Normal range of motion.  Cardiovascular: Normal rate, regular rhythm, normal heart sounds and intact distal pulses.   Pulmonary/Chest: Effort normal. No respiratory distress.  Abdominal: He exhibits no distension.    Musculoskeletal: Normal range of motion. He exhibits tenderness. He exhibits no edema or deformity.  LLE: exam reveals L foot with no edema or erythema. No TTP over the dorsal aspect of the L foot. No swelling over the ankle or TTP. No tenderness with active ROM over the ankle joint. No TTP over the achilles tendon, which appears to be intact. TTP over the arch of the L foot, more proximal to the heel. The tenderness seems to be concentrated over the plantar aspect of the heel.  Neurological: He is alert and oriented to person, place, and time.  Psychiatric: He has a normal mood and affect.  Nursing note and vitals reviewed.    ED Treatments / Results  DIAGNOSTIC STUDIES: Oxygen Saturation is 97% on RA, NL by my interpretation.    COORDINATION OF CARE: 12:24 AM-Discussed next steps with pt and family. They verbalized understanding and is agreeable with the plan. Will order imaging and refer to podiatrist.   Labs (all labs ordered are listed, but only abnormal results are displayed) Labs Reviewed  CBG MONITORING, ED - Abnormal; Notable for the following:       Result Value   Glucose-Capillary 167 (*)    All other components within normal limits    EKG  EKG Interpretation None       Radiology Dg Foot Complete Left  Result Date: 05/21/2017 CLINICAL DATA:  Left heel pain for 2 months. No known injury. History of diabetes, throat cancer, arthritis. EXAM: LEFT FOOT - COMPLETE 3+ VIEW COMPARISON:  None. FINDINGS: Focal benign-appearing sclerosis in the distal phalanx of the left first toe. No evidence of acute fracture or dislocation. No destructive or expansile bone lesions. Prominent vascular calcifications. Soft tissues are unremarkable. IMPRESSION: No acute bony abnormalities. Electronically Signed   By: Lucienne Capers M.D.   On: 05/21/2017 01:00    Procedures Procedures (including critical care time)  Medications Ordered in ED Medications  ibuprofen (ADVIL,MOTRIN) tablet  400 mg (400 mg Oral Given 05/21/17 0051)     Initial Impression / Assessment and Plan / ED Course  I have reviewed the triage vital signs and the nursing notes.  Pertinent labs & imaging results that were available during my care of the patient were reviewed by me and considered in my medical decision making (see chart for details).     Frankly - I am not sure what the cause for the heel pain is. Could be plantar fascitis, arch related issue, bone spurs, charcot's foot, tendonitis. No concerns for infection or fracture. Will have Podiatry follow up info so that proper diagnoses can be made and treatment initiated.  Final Clinical Impressions(s) / ED Diagnoses   Final diagnoses:  Pain of left heel    New Prescriptions  New Prescriptions   IBUPROFEN (ADVIL,MOTRIN) 400 MG TABLET    Take 1 tablet (400 mg total) by mouth every 6 (six) hours as needed.  I personally performed the services described in this documentation, which was scribed in my presence. The recorded information has been reviewed and is accurate.    Varney Biles, MD 05/21/17 [phone]

## 2017-05-21 NOTE — Discharge Instructions (Signed)
The Xrays are normal - no signs of bone spurs.  I am not sure what is the cause for the pain and would greatly appreciate if you saw the podiatrist for further evaluation of your pain.  Sorry for the delay -we have been really busy with several sick patients and appreciate your patience.

## 2017-05-31 ENCOUNTER — Ambulatory Visit (INDEPENDENT_AMBULATORY_CARE_PROVIDER_SITE_OTHER): Payer: Medicare Other | Admitting: Podiatry

## 2017-05-31 ENCOUNTER — Other Ambulatory Visit: Payer: Self-pay | Admitting: Podiatry

## 2017-05-31 DIAGNOSIS — M722 Plantar fascial fibromatosis: Secondary | ICD-10-CM

## 2017-05-31 MED ORDER — IBUPROFEN 600 MG PO TABS: 600.0000 mg | ORAL_TABLET | Freq: Three times a day (TID) | ORAL | 1 refills | Status: DC | PRN

## 2017-06-03 NOTE — Progress Notes (Signed)
   Subjective: Patient presents today for shooting pain and tenderness of the lateral side and plantar aspect of the left heel onset one month ago. Patient states the foot pain has been hurting for several weeks now. Patient states that it hurts in the mornings with the first steps out of bed. Patient presents today for further treatment and evaluation  Objective: Physical Exam General: The patient is alert and oriented x3 in no acute distress.  Dermatology: Skin is warm, dry and supple bilateral lower extremities. Negative for open lesions or macerations bilateral.   Vascular: Dorsalis Pedis and Posterior Tibial pulses palpable bilateral.  Capillary fill time is immediate to all digits.  Neurological: Epicritic and protective threshold intact bilateral.   Musculoskeletal: Tenderness to palpation at the medial calcaneal tubercale and through the insertion of the plantar fascia of the left foot. All other joints range of motion within normal limits bilateral. Strength 5/5 in all groups bilateral.    Assessment: 1. Plantar fasciitis left foot 2. Pain in left foot  Plan of Care:   1. Patient evaluated. Xrays from the ED reviewed.   2. Injection of 0.5cc Celestone soluspan injected into the left plantar fascia.  3. Instructed patient regarding therapies and modalities at home to alleviate symptoms.  4. Rx for Motrin 600 mg PO given to patient.  5. Continue wearing that shoe gear.  6. Plantar fascial band(s) dispensed. 7. Return to clinic in 4 weeks.     Edrick Kins, DPM [facility]  Dr. Edrick Kins, [address]                                        [place], [place] [address]                Office [phone]  Fax [phone]

## 2017-06-17 MED ORDER — BETAMETHASONE SOD PHOS & ACET 6 (3-3) MG/ML IJ SUSP: 3.0000 mg | Freq: Once | INTRAMUSCULAR | Status: DC

## 2017-07-03 ENCOUNTER — Ambulatory Visit (INDEPENDENT_AMBULATORY_CARE_PROVIDER_SITE_OTHER): Payer: Medicare Other | Admitting: Podiatry

## 2017-07-03 DIAGNOSIS — M722 Plantar fascial fibromatosis: Secondary | ICD-10-CM | POA: Diagnosis not present

## 2017-07-03 MED ORDER — IBUPROFEN 600 MG PO TABS: 600.0000 mg | ORAL_TABLET | Freq: Three times a day (TID) | ORAL | 0 refills | Status: DC | PRN

## 2017-07-21 MED ORDER — BETAMETHASONE SOD PHOS & ACET 6 (3-3) MG/ML IJ SUSP: 3.0000 mg | Freq: Once | INTRAMUSCULAR | Status: DC

## 2017-07-21 NOTE — Progress Notes (Signed)
   Subjective: Patient presents today for follow-up treatment and evaluation of plantar fasciitis to the left foot. Patient believes that the injections helped a little bit. He feels somewhat better. Patient has been taking Motrin 600 and wearing the plantar fascial brace.  Objective: Physical Exam General: The patient is alert and oriented x3 in no acute distress.  Dermatology: Skin is warm, dry and supple bilateral lower extremities. Negative for open lesions or macerations bilateral.   Vascular: Dorsalis Pedis and Posterior Tibial pulses palpable bilateral.  Capillary fill time is immediate to all digits.  Neurological: Epicritic and protective threshold intact bilateral.   Musculoskeletal: Tenderness to palpation at the medial calcaneal tubercale and through the insertion of the plantar fascia of the left foot. All other joints range of motion within normal limits bilateral. Strength 5/5 in all groups bilateral.    Assessment: 1. Plantar fasciitis left foot  Plan of Care:  1. Patient evaluated. Xrays reviewed.   2. Injection of 0.5cc Celestone soluspan injected into the left plantar fascia.  3. Continue wearing the plantar fascial brace, wearing good supportive sneakers, and taking Motrin 600 mg when necessary 4. Today refill prescription for Motrin 600mg  placed 5. Return to clinic in 4 weeks    Edrick Kins, DPM [facility]  Dr. Edrick Kins, DPM    [address], [place] [address]                Office [phone]  Fax [phone]

## 2017-07-31 ENCOUNTER — Ambulatory Visit: Payer: Medicare Other | Admitting: Podiatry

## 2017-08-14 DIAGNOSIS — H538 Other visual disturbances: Secondary | ICD-10-CM | POA: Diagnosis not present

## 2017-08-14 DIAGNOSIS — Z833 Family history of diabetes mellitus: Secondary | ICD-10-CM | POA: Diagnosis not present

## 2017-08-14 DIAGNOSIS — H1132 Conjunctival hemorrhage, left eye: Secondary | ICD-10-CM | POA: Diagnosis not present

## 2017-08-14 DIAGNOSIS — S098XXA Other specified injuries of head, initial encounter: Secondary | ICD-10-CM | POA: Diagnosis not present

## 2017-08-14 DIAGNOSIS — S0181XA Laceration without foreign body of other part of head, initial encounter: Secondary | ICD-10-CM | POA: Diagnosis not present

## 2017-08-14 DIAGNOSIS — S0990XA Unspecified injury of head, initial encounter: Secondary | ICD-10-CM | POA: Diagnosis not present

## 2018-01-06 DIAGNOSIS — J209 Acute bronchitis, unspecified: Secondary | ICD-10-CM | POA: Diagnosis not present

## 2018-08-09 DIAGNOSIS — Z8521 Personal history of malignant neoplasm of larynx: Secondary | ICD-10-CM | POA: Diagnosis not present

## 2018-08-09 DIAGNOSIS — D509 Iron deficiency anemia, unspecified: Secondary | ICD-10-CM | POA: Diagnosis not present

## 2018-08-09 DIAGNOSIS — J449 Chronic obstructive pulmonary disease, unspecified: Secondary | ICD-10-CM | POA: Diagnosis not present

## 2018-08-09 DIAGNOSIS — H9190 Unspecified hearing loss, unspecified ear: Secondary | ICD-10-CM | POA: Diagnosis not present

## 2018-08-09 DIAGNOSIS — R490 Dysphonia: Secondary | ICD-10-CM | POA: Diagnosis not present

## 2018-08-09 DIAGNOSIS — K635 Polyp of colon: Secondary | ICD-10-CM | POA: Diagnosis not present

## 2018-08-09 DIAGNOSIS — K5790 Diverticulosis of intestine, part unspecified, without perforation or abscess without bleeding: Secondary | ICD-10-CM | POA: Diagnosis not present

## 2018-08-09 DIAGNOSIS — N400 Enlarged prostate without lower urinary tract symptoms: Secondary | ICD-10-CM | POA: Diagnosis not present

## 2018-08-09 DIAGNOSIS — D510 Vitamin B12 deficiency anemia due to intrinsic factor deficiency: Secondary | ICD-10-CM | POA: Diagnosis not present

## 2018-08-09 DIAGNOSIS — E119 Type 2 diabetes mellitus without complications: Secondary | ICD-10-CM | POA: Diagnosis not present

## 2018-08-09 DIAGNOSIS — Z79899 Other long term (current) drug therapy: Secondary | ICD-10-CM | POA: Diagnosis not present

## 2018-08-09 DIAGNOSIS — I2510 Atherosclerotic heart disease of native coronary artery without angina pectoris: Secondary | ICD-10-CM | POA: Diagnosis not present

## 2018-08-09 DIAGNOSIS — Z08 Encounter for follow-up examination after completed treatment for malignant neoplasm: Secondary | ICD-10-CM | POA: Diagnosis not present

## 2018-08-09 DIAGNOSIS — K117 Disturbances of salivary secretion: Secondary | ICD-10-CM | POA: Diagnosis not present

## 2018-08-09 DIAGNOSIS — Z7984 Long term (current) use of oral hypoglycemic drugs: Secondary | ICD-10-CM | POA: Diagnosis not present

## 2018-08-09 DIAGNOSIS — G479 Sleep disorder, unspecified: Secondary | ICD-10-CM | POA: Diagnosis not present

## 2018-08-09 DIAGNOSIS — C321 Malignant neoplasm of supraglottis: Secondary | ICD-10-CM | POA: Diagnosis not present

## 2019-03-06 DIAGNOSIS — R05 Cough: Secondary | ICD-10-CM | POA: Diagnosis not present

## 2019-03-06 DIAGNOSIS — R490 Dysphonia: Secondary | ICD-10-CM | POA: Diagnosis not present

## 2019-03-06 DIAGNOSIS — D649 Anemia, unspecified: Secondary | ICD-10-CM | POA: Diagnosis not present

## 2019-03-06 DIAGNOSIS — E1151 Type 2 diabetes mellitus with diabetic peripheral angiopathy without gangrene: Secondary | ICD-10-CM | POA: Diagnosis not present

## 2019-03-06 DIAGNOSIS — E7849 Other hyperlipidemia: Secondary | ICD-10-CM | POA: Diagnosis not present

## 2019-03-06 DIAGNOSIS — D6489 Other specified anemias: Secondary | ICD-10-CM | POA: Diagnosis not present

## 2019-03-06 DIAGNOSIS — Z6826 Body mass index (BMI) 26.0-26.9, adult: Secondary | ICD-10-CM | POA: Diagnosis not present

## 2019-03-06 DIAGNOSIS — Z1331 Encounter for screening for depression: Secondary | ICD-10-CM | POA: Diagnosis not present

## 2019-03-06 DIAGNOSIS — N320 Bladder-neck obstruction: Secondary | ICD-10-CM | POA: Diagnosis not present

## 2019-03-06 DIAGNOSIS — J449 Chronic obstructive pulmonary disease, unspecified: Secondary | ICD-10-CM | POA: Diagnosis not present

## 2019-04-26 DIAGNOSIS — R0789 Other chest pain: Secondary | ICD-10-CM | POA: Diagnosis not present

## 2019-04-26 DIAGNOSIS — R030 Elevated blood-pressure reading, without diagnosis of hypertension: Secondary | ICD-10-CM | POA: Diagnosis not present

## 2019-04-26 DIAGNOSIS — E1151 Type 2 diabetes mellitus with diabetic peripheral angiopathy without gangrene: Secondary | ICD-10-CM | POA: Diagnosis not present

## 2019-04-26 DIAGNOSIS — J449 Chronic obstructive pulmonary disease, unspecified: Secondary | ICD-10-CM | POA: Diagnosis not present

## 2019-05-28 DIAGNOSIS — R490 Dysphonia: Secondary | ICD-10-CM | POA: Diagnosis not present

## 2019-05-28 DIAGNOSIS — Z8521 Personal history of malignant neoplasm of larynx: Secondary | ICD-10-CM

## 2019-05-28 DIAGNOSIS — Z923 Personal history of irradiation: Secondary | ICD-10-CM | POA: Diagnosis not present

## 2019-07-22 DIAGNOSIS — E1151 Type 2 diabetes mellitus with diabetic peripheral angiopathy without gangrene: Secondary | ICD-10-CM | POA: Diagnosis not present

## 2019-07-22 DIAGNOSIS — E538 Deficiency of other specified B group vitamins: Secondary | ICD-10-CM | POA: Diagnosis not present

## 2019-07-22 DIAGNOSIS — J449 Chronic obstructive pulmonary disease, unspecified: Secondary | ICD-10-CM | POA: Diagnosis not present

## 2019-07-22 DIAGNOSIS — R490 Dysphonia: Secondary | ICD-10-CM | POA: Diagnosis not present

## 2019-09-03 DIAGNOSIS — H25013 Cortical age-related cataract, bilateral: Secondary | ICD-10-CM | POA: Diagnosis not present

## 2019-09-03 DIAGNOSIS — E119 Type 2 diabetes mellitus without complications: Secondary | ICD-10-CM | POA: Diagnosis not present

## 2019-09-03 DIAGNOSIS — H35363 Drusen (degenerative) of macula, bilateral: Secondary | ICD-10-CM | POA: Diagnosis not present

## 2019-09-03 DIAGNOSIS — H2513 Age-related nuclear cataract, bilateral: Secondary | ICD-10-CM | POA: Diagnosis not present

## 2019-09-03 DIAGNOSIS — H35033 Hypertensive retinopathy, bilateral: Secondary | ICD-10-CM | POA: Diagnosis not present

## 2019-10-25 DIAGNOSIS — E7849 Other hyperlipidemia: Secondary | ICD-10-CM | POA: Diagnosis not present

## 2019-10-25 DIAGNOSIS — R82998 Other abnormal findings in urine: Secondary | ICD-10-CM | POA: Diagnosis not present

## 2019-10-25 DIAGNOSIS — E1151 Type 2 diabetes mellitus with diabetic peripheral angiopathy without gangrene: Secondary | ICD-10-CM | POA: Diagnosis not present

## 2019-10-25 DIAGNOSIS — E538 Deficiency of other specified B group vitamins: Secondary | ICD-10-CM | POA: Diagnosis not present

## 2019-10-25 DIAGNOSIS — Z125 Encounter for screening for malignant neoplasm of prostate: Secondary | ICD-10-CM | POA: Diagnosis not present

## 2019-10-31 DIAGNOSIS — J449 Chronic obstructive pulmonary disease, unspecified: Secondary | ICD-10-CM | POA: Diagnosis not present

## 2019-10-31 DIAGNOSIS — E538 Deficiency of other specified B group vitamins: Secondary | ICD-10-CM | POA: Diagnosis not present

## 2019-10-31 DIAGNOSIS — R1310 Dysphagia, unspecified: Secondary | ICD-10-CM | POA: Diagnosis not present

## 2019-10-31 DIAGNOSIS — E785 Hyperlipidemia, unspecified: Secondary | ICD-10-CM | POA: Diagnosis not present

## 2019-10-31 DIAGNOSIS — I739 Peripheral vascular disease, unspecified: Secondary | ICD-10-CM | POA: Diagnosis not present

## 2019-10-31 DIAGNOSIS — Z0000 Encounter for general adult medical examination without abnormal findings: Secondary | ICD-10-CM | POA: Diagnosis not present

## 2019-10-31 DIAGNOSIS — N320 Bladder-neck obstruction: Secondary | ICD-10-CM | POA: Diagnosis not present

## 2019-11-14 ENCOUNTER — Other Ambulatory Visit (HOSPITAL_COMMUNITY): Payer: Self-pay | Admitting: *Deleted

## 2019-11-14 DIAGNOSIS — R1310 Dysphagia, unspecified: Secondary | ICD-10-CM

## 2019-11-14 DIAGNOSIS — R059 Cough, unspecified: Secondary | ICD-10-CM

## 2019-11-14 DIAGNOSIS — R05 Cough: Secondary | ICD-10-CM

## 2019-11-22 ENCOUNTER — Other Ambulatory Visit: Payer: Self-pay

## 2019-11-22 ENCOUNTER — Ambulatory Visit (HOSPITAL_COMMUNITY)
Admission: RE | Admit: 2019-11-22 | Discharge: 2019-11-22 | Disposition: A | Discharge status: Home / Self Care | Payer: Medicare Other | Source: Ambulatory Visit | Attending: Internal Medicine | Admitting: Internal Medicine

## 2019-11-22 DIAGNOSIS — R05 Cough: Secondary | ICD-10-CM | POA: Diagnosis not present

## 2019-11-22 DIAGNOSIS — R059 Cough, unspecified: Secondary | ICD-10-CM

## 2019-11-22 DIAGNOSIS — R1310 Dysphagia, unspecified: Secondary | ICD-10-CM | POA: Diagnosis not present

## 2020-02-24 ENCOUNTER — Ambulatory Visit: Payer: Self-pay

## 2020-02-28 DIAGNOSIS — I739 Peripheral vascular disease, unspecified: Secondary | ICD-10-CM | POA: Diagnosis not present

## 2020-02-28 DIAGNOSIS — E1151 Type 2 diabetes mellitus with diabetic peripheral angiopathy without gangrene: Secondary | ICD-10-CM | POA: Diagnosis not present

## 2020-02-28 DIAGNOSIS — J449 Chronic obstructive pulmonary disease, unspecified: Secondary | ICD-10-CM | POA: Diagnosis not present

## 2020-02-28 DIAGNOSIS — E538 Deficiency of other specified B group vitamins: Secondary | ICD-10-CM | POA: Diagnosis not present

## 2020-02-28 DIAGNOSIS — Z1331 Encounter for screening for depression: Secondary | ICD-10-CM | POA: Diagnosis not present

## 2020-02-28 DIAGNOSIS — R1310 Dysphagia, unspecified: Secondary | ICD-10-CM | POA: Diagnosis not present

## 2020-07-10 DIAGNOSIS — E1151 Type 2 diabetes mellitus with diabetic peripheral angiopathy without gangrene: Secondary | ICD-10-CM | POA: Diagnosis not present

## 2020-07-15 ENCOUNTER — Other Ambulatory Visit: Payer: Self-pay

## 2020-07-15 ENCOUNTER — Ambulatory Visit: Payer: Medicare Other | Attending: Internal Medicine | Admitting: Speech Pathology

## 2020-07-15 DIAGNOSIS — R1313 Dysphagia, pharyngeal phase: Secondary | ICD-10-CM | POA: Diagnosis not present

## 2020-07-15 NOTE — Patient Instructions (Signed)
   Try to reduce distractions during meal  Fork down in between bites  Swallow all solids, then take a small liquid sip - do not put liquid and solids in your mouth at the same time  Foods that have solids and liquids together (soups, cereals, fruit cocktails)  The liquid component spills into the throat while you are sill trying to manage the solid  Take a liquid sip, hold your breath, then swallow - practice this at breakfast and lunch  Keep exercising and walking to keep your lungs clear  When you have to cough, go a head and really cough - don't hold the cough in to be polite  Don't talk while you need to cough

## 2020-07-15 NOTE — Therapy (Signed)
[facility] [address] [place], [place], [address] Phone: [phone]   Fax:  [phone]  Speech Language Pathology Evaluation  Patient Details  Name: Raymond Grant MRN: [id number] Date of Birth: [birth year] Referring Provider (SLP): Dr. Bevelyn Buckles   Encounter Date: 07/15/2020   End of Session - 07/15/20 1206    Visit Number 1    Number of Visits 17    Date for SLP Re-Evaluation 09/09/20    SLP Start Time 1110    SLP Stop Time  1145    SLP Time Calculation (min) 35 min    Activity Tolerance Patient tolerated treatment well           Past Medical History:  Diagnosis Date  . Arthritis   . Cancer ([place]) 2007   throat  . Diabetes mellitus without complication ([place])   . GERD (gastroesophageal reflux disease)   . Hyperlipidemia   . Peptic ulcer disease     Past Surgical History:  Procedure Laterality Date  . ABDOMINAL SURGERY    . HERNIA REPAIR    . PEG TUBE PLACEMENT    . PEG TUBE REMOVAL    . REPAIR OF PERFORATED ULCER      There were no vitals filed for this visit.   Subjective Assessment - 07/15/20 1155    Subjective "He coughs with water, but not beer"    Patient is accompained by: Family member   daughter, Raymond Grant   Currently in Pain? No/denies              SLP Evaluation [facility] - 07/15/20 1155      SLP Visit Information   SLP Received On 07/15/20    Referring Provider (SLP) Dr. 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 H/o of coughing with PO. ENT Dr. Constance Holster 05/28/19 - vocal cords mobile, no lesions in hypopharynx or laryns. MBSS 11/22/19 - Mild pharyngeal dysphagia with late closure of laryngeal vestibule resulting in sensed aspriation of thin liquids and penetration of  nectar thick. no residue, chin tuck not effective    Mobility Status walks independenlty      Balance Screen   Has the patient fallen in the past 6 months No    Has the patient had a decrease in activity level because of a fear of falling?  No    Is the patient reluctant to leave their home because of a fear of falling?  No      Prior Functional Status   Cognitive/Linguistic Baseline Within functional limits    Type of Home Apartment     Lives With Spouse    Available Support Family    Vocation Retired      Associate Professor   Overall Cognitive Status Within Functional Limits for tasks assessed    Attention --   daughter and pt deny cognitive difficulties     Auditory Comprehension   Overall Auditory Comprehension Appears within functional limits for tasks assessed      Expression   Primary Mode of Expression Verbal      Verbal Expression   Overall Verbal Expression Appears within functional limits for tasks assessed      Oral Motor/Sensory Function   Overall Oral Motor/Sensory Function Impaired  Labial ROM Within Functional Limits    Labial Symmetry Within Functional Limits    Labial Strength Within Functional Limits    Labial Sensation Within Functional Limits    Labial Coordination WFL    Lingual ROM Reduced right;Other (Comment)   elevation   Lingual Symmetry Within Functional Limits    Lingual Strength Within Functional Limits    Lingual Coordination WFL    Facial ROM Within Functional Limits    Velum Within Functional Limits    Mandible Within Functional Limits    Overall Oral Motor/Sensory Function --   cough and swallow to command intact - hoarse cough     Motor Speech   Overall Motor Speech Impaired    Respiration Within functional limits    Phonation Breathy;Hoarse    Resonance Within functional limits    Articulation Within functional limitis    Intelligibility Intelligible    Motor Planning Witnin functional limits    Phonation --                            SLP Education - 07/15/20 1205    Education Details supraglottic swallow; swallow precautions, reduce distractions    Person(s) Educated Patient;Child(ren)    Methods Explanation;Demonstration;Verbal cues;Handout    Comprehension Verbalized understanding;Returned demonstration;Verbal cues required;Need further instruction            SLP Short Term Goals - 07/15/20 1212      SLP SHORT TERM GOAL #1   Title Pt will follow swallow precautions with occasional min A from caregivers over 3 sessions    Time 4    Period Weeks    Status New      SLP SHORT TERM GOAL #2   Title Pt will perfrom HEP for dysphagia with occasional min A over 2 sessions    Time 4    Period Weeks    Status New      SLP SHORT TERM GOAL #3   Title Pt/family will verbalize s/s of aspiration pna and 2 strategies to prevent aspiration pna(exercise, don't hold in cough, oral care etc)    Time 4    Period Weeks    Status New            SLP Long Term Goals - 07/15/20 1215      SLP LONG TERM GOAL #1   Title Pt will follow swallow precautions with rare min A over 3 sessions per family report    Time 8    Period Weeks    Status New      SLP LONG TERM GOAL #2   Title Pt will complete HEP for dysphagia with rare min A over 2 sessions    Time 8    Period Weeks    Status New      SLP LONG TERM GOAL #3   Title Pt/Family will report 25% less coughing with meals subjectively over 3 sessions    Time 8    Period Weeks    Status New            Plan - 07/15/20 1207    Clinical Impression Statement Mr. Raymond Grant is referred for outpt speech therapy for mild pharyngeal dysphagia s/p laryngeal cancer 2007. He is accompanied by his daughter, Raymond Grant. They deny changes or worsening since MBSS11/2020. Hosie coughs with water duirng meals and presents with mild to moderate dysphonia - both have remained stable. He is not avoiding any foods, and meds  are taken without  coughing.  PO  multiple trials of hard solid and thin liquids revealed no overt s/s of aspiration. Further investigation revealed that coughing is duirng meals. Raymond Grant affirms he east fast and takes liquid sips while solid is still in his mouht. Raymond Grant reports large family with lots of talking and laughing. Of note, Raymond Grant drinks a beer without taking food, and does not cough or choke. Reviewed recommendations from MBSS. Daughter reports they are not following these. Initiated training on swallow precautions and external strategies to reduce speed of meal. See pt instructions. I  recommend skilled ST to maximize safety of swallow and reduce risk of aspiration pna. MBSS rec. regular solid, thin liquid; slow rate, meds with liquid    Speech Therapy Frequency 2x / week    Duration --   8 weeks or 17 visits   Treatment/Interventions Aspiration precaution training;Pharyngeal strengthening exercises;NMES;Diet toleration management by SLP;Trials of upgraded texture/liquids;Compensatory techniques;Environmental controls;Internal/external aids;Patient/family education    Potential to Achieve Goals Good    Consulted and Agree with Plan of Care Patient           Patient will benefit from skilled therapeutic intervention in order to improve the following deficits and impairments:   Dysphagia, pharyngeal phase    Problem List Patient Active Problem List   Diagnosis Date Noted  . Peptic ulcer disease     Francena Zender, Annye Rusk MS, CCC-SLP 07/15/2020, 12:26 PM  [facility] [address] [place], [place], [address] Phone: [phone]   Fax:  [phone]  Name: Raymond Grant MRN: [id number] Date of Birth: 04/11/1934

## 2020-07-27 ENCOUNTER — Other Ambulatory Visit: Payer: Self-pay

## 2020-07-27 ENCOUNTER — Ambulatory Visit: Payer: Medicare Other | Attending: Internal Medicine

## 2020-07-27 DIAGNOSIS — R1313 Dysphagia, pharyngeal phase: Secondary | ICD-10-CM | POA: Diagnosis not present

## 2020-07-27 NOTE — Patient Instructions (Signed)
° ° °  EXERCISES- We hope your swallow will get stronger by doing these every day:  1) super swallow-- 10 times, 3 times a day  Hold your breath  Take a sip  Cough  2) Mendelsohn-- 10 times  3 times a day  Swallow and hold tight for 5 seconds

## 2020-07-27 NOTE — Therapy (Signed)
[facility] [address] [place], [place], [address] Phone: [phone]   Fax:  [phone]  Speech Language Pathology Treatment  Patient Details  Name: Raymond Grant MRN: [id number] Date of Birth: 1934-10-24 Referring Provider (SLP): Dr. Bevelyn Buckles   Encounter Date: 07/27/2020   End of Session - 07/27/20 1359    Visit Number 2    Number of Visits 17    Date for SLP Re-Evaluation 09/09/20    SLP Start Time 0935    SLP Stop Time  8563    SLP Time Calculation (min) 40 min    Activity Tolerance Patient tolerated treatment well           Past Medical History:  Diagnosis Date  . Arthritis   . 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 Diabetes mellitus without complication ([place])   . GERD (gastroesophageal reflux disease)   . Hyperlipidemia   . Peptic ulcer disease     Past Surgical History:  Procedure Laterality Date  . ABDOMINAL SURGERY    . HERNIA REPAIR    . PEG TUBE PLACEMENT    . PEG TUBE REMOVAL    . REPAIR OF PERFORATED ULCER      There were no vitals filed for this visit.   Subjective Assessment - 07/27/20 0944    Subjective Pt is happy to work with unfamiliar therapist this morning.    Patient is accompained by: Family member   Raymond Grant   Currently in Pain? No/denies                 ADULT SLP TREATMENT - 07/27/20 0945      General Information   Behavior/Cognition Alert;Cooperative;Pleasant mood      Treatment Provided   Treatment provided Dysphagia      Dysphagia Treatment   Temperature Spikes Noted No    Respiratory Status Room air    Oral Cavity - Dentition Dentures, top;Dentures, bottom    Treatment Methods Skilled observation;Therapeutic exercise;Patient/caregiver education;Compensation strategy training    Patient observed directly with PO's Yes    Type of PO's observed Dysphagia 3 (soft);Thin liquids    Liquids provided via Cup    Oral Phase Signs & Symptoms --   no  difficulties noted   Pharyngeal Phase Signs & Symptoms --   no difficulties noted   Other treatment/comments Educated pt and daughter on rationale for precautions - no questions when asked directly for questions by SLP. SPL educated pt/daughter in HEP for [place] and for super-supraglottic swallow ("super swallow"). SLP told pt/daughter that at some time in the future the cough may be removed from the HEP (i.e., changed to supraglottic). SLP reqw'd to provide pt with mod A for teaching these two exercises, faded to modified independent. WIth swallow precautions, pt req'd teaching the super-supraglottic but followed this precaution with fading cues with PO liquids (water) x8 swallows over time. Pt finished solids prior to drinking with just initial reminder cues.       Assessment / Recommendations / Plan   Plan Continue with current plan of care      Dysphagia Recommendations   Diet recommendations Regular;Thin liquid    Compensations Slow rate;Small sips/bites    Postural Changes and/or Swallow Maneuvers Hold breath during swallow, followed by a cough (Super supraglottic swallow)   with liquids     Progression Toward Goals   Progression toward goals Progressing toward goals            SLP Education -  07/27/20 1358    Education Details HEP, super-supraglottic swallow (swallow precautions)    Person(s) Educated Patient;Child(ren)    Methods Explanation;Demonstration;Verbal cues    Comprehension Verbalized understanding;Returned demonstration;Verbal cues required;Need further instruction            SLP Short Term Goals - 07/27/20 1401      SLP SHORT TERM GOAL #1   Title Pt will follow swallow precautions with occasional min A from caregivers over 3 sessions    Time 4    Period Weeks    Status On-going      SLP SHORT TERM GOAL #2   Title Pt will perfrom HEP for dysphagia with occasional min A over 2 sessions    Time 4    Period Weeks    Status On-going      SLP SHORT TERM  GOAL #3   Title Pt/family will verbalize s/s of aspiration pna and 2 strategies to prevent aspiration pna(exercise, don't hold in cough, oral care etc)    Time 4    Period Weeks    Status On-going            SLP Long Term Goals - 07/27/20 1401      SLP LONG TERM GOAL #1   Title Pt will follow swallow precautions with rare min A over 3 sessions per family report    Time 8    Period Weeks    Status On-going      SLP LONG TERM GOAL #2   Title Pt will complete HEP for dysphagia with rare min A over 2 sessions    Time 8    Period Weeks    Status On-going      SLP LONG TERM GOAL #3   Title Pt/Family will report 25% less coughing with meals subjectively over 3 sessions    Time 8    Period Weeks    Status On-going            Plan - 07/27/20 1359    Clinical Impression Statement Raymond Grant is referred for outpt speech therapy for mild pharyngeal dysphagia s/p laryngeal cancer 2007. He is accompanied by his daughter, Raymond Grant. SLP again Reviewed recommendations from MBSS. See pt instructions for HEP. SLP modified pt's supraglottic to super-supraglottic, with information provided to pt that at some time in the future the exercise may have the cough removed at the end. I  recommend skilled ST to maximize safety of swallow and reduce risk of aspiration pna. MBSS rec. regular solid, thin liquid; slow rate, meds with liquid    Speech Therapy Frequency 2x / week    Duration --   8 weeks or 17 visits   Treatment/Interventions Aspiration precaution training;Pharyngeal strengthening exercises;NMES;Diet toleration management by SLP;Trials of upgraded texture/liquids;Compensatory techniques;Environmental controls;Internal/external aids;Patient/family education    Potential to Achieve Goals Good    Consulted and Agree with Plan of Care Patient           Patient will benefit from skilled therapeutic intervention in order to improve the following deficits and impairments:    Dysphagia, pharyngeal phase    Problem List Patient Active Problem List   Diagnosis Date Noted  . Peptic ulcer disease     Raymond Grant ,MS, CCC-SLP  07/27/2020, 2:02 PM  [facility] [address] [place], [place], [address] Phone: [phone]   Fax:  [phone]   Name: Raymond Grant MRN: [id number] Date of Birth: 05-28-1934

## 2020-08-05 ENCOUNTER — Ambulatory Visit: Payer: Medicare Other | Admitting: Speech Pathology

## 2020-08-10 ENCOUNTER — Ambulatory Visit: Payer: Medicare Other | Admitting: Speech Pathology

## 2020-08-10 ENCOUNTER — Encounter: Payer: Self-pay | Admitting: Speech Pathology

## 2020-08-10 ENCOUNTER — Other Ambulatory Visit: Payer: Self-pay

## 2020-08-10 DIAGNOSIS — R1313 Dysphagia, pharyngeal phase: Secondary | ICD-10-CM | POA: Diagnosis not present

## 2020-08-10 NOTE — Patient Instructions (Signed)
   Mendelsohn: Swallow and hold tight 5 seconds 10x 3x a day  Sip, hold breath, swallow, cough - 10x 3x a day    Signs of Aspiration Pneumonia   . Chest pain/tightness . Fever (can be low grade) . Cough  o With foul-smelling phlegm (sputum) o With sputum containing pus or blood o With greenish sputum . Fatigue  . Shortness of breath  . Wheezing   **IF YOU HAVE THESE SIGNS, CONTACT YOUR DOCTOR OR GO TO THE EMERGENCY DEPARTMENT OR URGENT CARE AS SOON AS POSSIBLE**

## 2020-08-12 ENCOUNTER — Ambulatory Visit: Payer: Medicare Other | Admitting: Speech Pathology

## 2020-08-12 NOTE — Therapy (Signed)
[facility] [address] [place], [place], [address] Phone: [phone]   Fax:  [phone]  Speech Language Pathology Treatment  Patient Details  Name: Raymond Grant MRN: [id number] Date of Birth: 14-Nov-1934 Referring Provider (SLP): Dr. Bevelyn Buckles   Encounter Date: 08/10/2020   End of Session - 08/12/20 0728    Visit Number 3    Number of Visits 17    Date for SLP Re-Evaluation 09/09/20    SLP Start Time 1315    SLP Stop Time  1355    SLP Time Calculation (min) 40 min    Activity Tolerance Patient tolerated treatment well           Past Medical History:  Diagnosis Date  . Arthritis   . 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 Diabetes mellitus without complication ([place])   . GERD (gastroesophageal reflux disease)   . Hyperlipidemia   . Peptic ulcer disease     Past Surgical History:  Procedure Laterality Date  . ABDOMINAL SURGERY    . HERNIA REPAIR    . PEG TUBE PLACEMENT    . PEG TUBE REMOVAL    . REPAIR OF PERFORATED ULCER      There were no vitals filed for this visit.            SLP Education - 08/12/20 0726    Education Details HEP, suer supraglottic swallow    Person(s) Educated Patient;Child(ren)    Methods Explanation;Demonstration;Verbal cues;Handout    Comprehension Returned demonstration;Verbal cues required;Need further instruction            SLP Short Term Goals - 08/12/20 0727      SLP SHORT TERM GOAL #1   Title Pt will follow swallow precautions with occasional min A from caregivers over 3 sessions    Time 3    Period Weeks    Status On-going      SLP SHORT TERM GOAL #2   Title Pt will perfrom HEP for dysphagia with occasional min A over 2 sessions    Time 3    Period Weeks    Status On-going      SLP SHORT TERM GOAL #3   Title Pt/family will verbalize s/s of aspiration pna and 2 strategies to prevent aspiration pna(exercise, don't hold in cough, oral care  etc)    Time 3    Period Weeks    Status On-going            SLP Long Term Goals - 08/12/20 0727      SLP LONG TERM GOAL #1   Title Pt will follow swallow precautions with rare min A over 3 sessions per family report    Time 7    Period Weeks    Status On-going      SLP LONG TERM GOAL #2   Title Pt will complete HEP for dysphagia with rare min A over 2 sessions    Time 7    Period Weeks    Status On-going      SLP LONG TERM GOAL #3   Title Pt/Family will report 25% less coughing with meals subjectively over 3 sessions    Time 7    Period Weeks    Status On-going            Plan - 08/12/20 0726    Clinical Impression Statement Mr. Raymond Grant is referred for outpt speech therapy for mild pharyngeal dysphagia s/p laryngeal cancer 2007. He is  accompanied by his daughter, Raymond Grant. SLP again Reviewed recommendations from MBSS. See pt instructions for HEP. SLP modified pt's supraglottic to super-supraglottic, with information provided to pt that at some time in the future the exercise may have the cough removed at the end. I  recommend skilled ST to maximize safety of swallow and reduce risk of aspiration pna. MBSS rec. regular solid, thin liquid; slow rate, meds with liquid    Speech Therapy Frequency 2x / week    Duration --   8 weeks or 17 visits   Treatment/Interventions Aspiration precaution training;Pharyngeal strengthening exercises;NMES;Diet toleration management by SLP;Trials of upgraded texture/liquids;Compensatory techniques;Environmental controls;Internal/external aids;Patient/family education           Patient will benefit from skilled therapeutic intervention in order to improve the following deficits and impairments:   Dysphagia, pharyngeal phase    Problem List Patient Active Problem List   Diagnosis Date Noted  . Peptic ulcer disease     Kreig Parson, Annye Rusk MS, CCC-SLP 08/12/2020, 7:29 AM  [facility] [address] [place], [place], [address] Phone: [phone]   Fax:  [phone]   Name: Raymond Grant MRN: [id number] Date of Birth: 1934/01/06

## 2020-08-14 DIAGNOSIS — R1314 Dysphagia, pharyngoesophageal phase: Secondary | ICD-10-CM | POA: Insufficient documentation

## 2020-08-17 DIAGNOSIS — J383 Other diseases of vocal cords: Secondary | ICD-10-CM | POA: Diagnosis not present

## 2020-08-17 DIAGNOSIS — R1314 Dysphagia, pharyngoesophageal phase: Secondary | ICD-10-CM | POA: Diagnosis not present

## 2020-08-17 DIAGNOSIS — Z87891 Personal history of nicotine dependence: Secondary | ICD-10-CM | POA: Diagnosis not present

## 2020-08-17 DIAGNOSIS — Z8521 Personal history of malignant neoplasm of larynx: Secondary | ICD-10-CM | POA: Diagnosis not present

## 2020-08-17 DIAGNOSIS — Z923 Personal history of irradiation: Secondary | ICD-10-CM | POA: Diagnosis not present

## 2020-08-17 DIAGNOSIS — J384 Edema of larynx: Secondary | ICD-10-CM | POA: Diagnosis not present

## 2020-08-17 DIAGNOSIS — R1313 Dysphagia, pharyngeal phase: Secondary | ICD-10-CM | POA: Diagnosis not present

## 2020-08-19 ENCOUNTER — Encounter: Payer: Self-pay | Admitting: Speech Pathology

## 2020-08-19 ENCOUNTER — Ambulatory Visit: Payer: Medicare Other | Admitting: Speech Pathology

## 2020-08-19 ENCOUNTER — Other Ambulatory Visit: Payer: Self-pay

## 2020-08-19 DIAGNOSIS — R1313 Dysphagia, pharyngeal phase: Secondary | ICD-10-CM | POA: Diagnosis not present

## 2020-08-19 NOTE — Therapy (Signed)
[facility] [address] [place], [place], [address] Phone: [phone]   Fax:  [phone]  Speech Language Pathology Treatment  Patient Details  Name: Raymond Grant MRN: [id number] Date of Birth: 05/23/1934 Referring Provider (SLP): Dr. Bevelyn Buckles   Encounter Date: 08/19/2020   End of Session - 08/19/20 1351    Visit Number 4    Number of Visits 17    Date for SLP Re-Evaluation 09/09/20    SLP Start Time 6962    SLP Stop Time  1345    SLP Time Calculation (min) 28 min    Activity Tolerance Patient tolerated treatment well           Past Medical History:  Diagnosis Date  . Arthritis   . Cancer ([place]) 2007   throat  . Diabetes mellitus without complication ([place])   . GERD (gastroesophageal reflux disease)   . Hyperlipidemia   . Peptic ulcer disease     Past Surgical History:  Procedure Laterality Date  . ABDOMINAL SURGERY    . HERNIA REPAIR    . PEG TUBE PLACEMENT    . PEG TUBE REMOVAL    . REPAIR OF PERFORATED ULCER      There were no vitals filed for this visit.   Subjective Assessment - 08/19/20 1326    Subjective "He was coughing and my sister said that was good"    Currently in Pain? No/denies                 ADULT SLP TREATMENT - 08/19/20 1327      General Information   Behavior/Cognition Alert;Cooperative;Pleasant mood      Treatment Provided   Treatment provided Cognitive-Linquistic      Dysphagia Treatment   Temperature Spikes Noted No    Respiratory Status Room air    Oral Cavity - Dentition Dentures, top;Dentures, bottom    Treatment Methods Skilled observation;Therapeutic exercise;Patient/caregiver education;Compensation strategy training    Patient observed directly with PO's Yes    Type of PO's observed Dysphagia 3 (soft);Thin liquids    Feeding Able to feed self    Liquids provided via Cup    Type of cueing Verbal;Tactile;Visual    Amount of cueing  Minimal    Other treatment/comments Raymond Grant completing swallow HEP with rare min A. He reports following swallow precautions at home with mod I.  He attends session  alone today. No overt s/s of aspiration today.      Assessment / Recommendations / Plan   Plan Continue with current plan of care      Dysphagia Recommendations   Diet recommendations Dysphagia 3 (mechanical soft)    Liquids provided via Cup    Medication Administration Whole meds with puree    Supervision Patient able to self feed    Compensations Slow rate;Small sips/bites    Postural Changes and/or Swallow Maneuvers Hold breath during swallow, followed by a cough (Super supraglottic swallow)      Progression Toward Goals   Progression toward goals Progressing toward goals            SLP Education - 08/19/20 1348    Education Details HEP, swallow precautions    Person(s) Educated Patient    Methods Explanation;Demonstration;Handout    Comprehension Returned demonstration;Verbal cues required;Need further instruction            SLP Short Term Goals - 08/19/20 1350      SLP SHORT TERM GOAL #1   Title Pt will  follow swallow precautions with occasional min A from caregivers over 3 sessions    Time 2    Period Weeks    Status Achieved      SLP SHORT TERM GOAL #2   Title Pt will perfrom HEP for dysphagia with occasional min A over 2 sessions    Time 2    Period Weeks    Status Achieved      SLP SHORT TERM GOAL #3   Title Pt/family will verbalize s/s of aspiration pna and 2 strategies to prevent aspiration pna(exercise, don't hold in cough, oral care etc)    Time 2    Period Weeks    Status On-going            SLP Long Term Goals - 08/19/20 1350      SLP LONG TERM GOAL #1   Title Pt will follow swallow precautions with rare min A over 3 sessions per family report    Baseline 08/19/20    Time 6    Period Weeks    Status On-going      SLP LONG TERM GOAL #2   Title Pt will complete HEP for dysphagia  with rare min A over 2 sessions    Time 6    Period Weeks    Status On-going      SLP LONG TERM GOAL #3   Title Pt/Family will report 25% less coughing with meals subjectively over 3 sessions    Baseline 08/19/20    Time 7    Period Weeks    Status On-going            Plan - 08/19/20 1348    Clinical Impression Statement Raymond Grant continues to progress in becoming more accurate and independent with HEP and swallow precautions. He reports reduced coughing and choking with meals. Continue skilled ST 1-2 more visits to reduce risk of aspiration pna.    Speech Therapy Frequency 2x / week    Duration --   8 weeks or 17 visits   Treatment/Interventions Aspiration precaution training;Pharyngeal strengthening exercises;NMES;Diet toleration management by SLP;Trials of upgraded texture/liquids;Compensatory techniques;Environmental controls;Internal/external aids;Patient/family education    Potential to Achieve Goals Good           Patient will benefit from skilled therapeutic intervention in order to improve the following deficits and impairments:   Dysphagia, pharyngeal phase    Problem List Patient Active Problem List   Diagnosis Date Noted  . Peptic ulcer disease     Naveah Brave, Annye Rusk MS, CCC-SLP 08/19/2020, 1:51 PM  [facility] [address] [place], [place], [address] Phone: [phone]   Fax:  [phone]   Name: Raymond Grant MRN: [id number] Date of Birth: 01/09/1934

## 2020-08-19 NOTE — Patient Instructions (Signed)
° °  Monday 10:15  8/30/21new appointment time  If you are doing well, and get a good grade from [employer], this appointment may be your last

## 2020-08-24 ENCOUNTER — Encounter: Payer: Self-pay | Admitting: Speech Pathology

## 2020-08-24 ENCOUNTER — Other Ambulatory Visit: Payer: Self-pay

## 2020-08-24 ENCOUNTER — Ambulatory Visit: Payer: Medicare Other | Admitting: Speech Pathology

## 2020-08-24 DIAGNOSIS — R1313 Dysphagia, pharyngeal phase: Secondary | ICD-10-CM | POA: Diagnosis not present

## 2020-08-24 NOTE — Therapy (Signed)
[facility] [address] [place], [place], [address] Phone: [phone]   Fax:  [phone]  Speech Language Pathology Treatment  Patient Details  Name: Raymond Grant MRN: [id number] Date of Birth: 1934-06-01 Referring Provider (SLP): Dr. Bevelyn Buckles   Encounter Date: 08/24/2020   End of Session - 08/24/20 1047    Visit Number 5    Number of Visits 17    Date for SLP Re-Evaluation 09/09/20    SLP Start Time 1017    SLP Stop Time  1224    SLP Time Calculation (min) 22 min    Activity Tolerance Patient tolerated treatment well           Past Medical History:  Diagnosis Date   Arthritis    Cancer ([place]) 2007   throat   Diabetes mellitus without complication (HCC)    GERD (gastroesophageal reflux disease)    Hyperlipidemia    Peptic ulcer disease     Past Surgical History:  Procedure Laterality Date   ABDOMINAL SURGERY     HERNIA REPAIR     PEG TUBE PLACEMENT     PEG TUBE REMOVAL     REPAIR OF PERFORATED ULCER      There were no vitals filed for this visit.   Subjective Assessment - 08/24/20 1022    Subjective "He is doing great"    Patient is accompained by: Family member   daughter, Raymond Grant   Currently in Pain? No/denies                 ADULT SLP TREATMENT - 08/24/20 1023      General Information   Behavior/Cognition Alert;Cooperative;Pleasant mood      Treatment Provided   Treatment provided Dysphagia      Dysphagia Treatment   Temperature Spikes Noted No    Respiratory Status Room air    Oral Cavity - Dentition Dentures, top;Dentures, bottom    Treatment Methods Skilled observation;Therapeutic exercise;Patient/caregiver education;Compensation strategy training    Patient observed directly with PO's Yes    Type of PO's observed Dysphagia 3 (soft);Thin liquids    Feeding Able to feed self    Liquids provided via Cup    Type of cueing Verbal    Amount of  cueing Minimal    Other treatment/comments [name] reports no coughing episodes over 3 meals he's had at her house. Draco verbalizes and demonstrates swallow precautions with mod I. He followed swallow precautions with mod I in ST today. [name] demonstrated HEP with rare min A and adequate cueing from daughter. She reports 50% reduction in coughing with meals      Assessment / Recommendations / Plan   Plan Continue with current plan of care      Dysphagia Recommendations   Diet recommendations Dysphagia 3 (mechanical soft);Thin liquid    Liquids provided via Cup    Medication Administration Whole meds with puree    Supervision Patient able to self feed    Compensations Slow rate;Small sips/bites    Postural Changes and/or Swallow Maneuvers Hold breath during swallow, followed by a cough (Super supraglottic swallow)   no liquid until solid swallowed and mouth clear; limit distr     Progression Toward Goals   Progression toward goals Goals met, education completed, patient discharged from [facility] - 08/24/20 1044    Education Details continue precautions and HEP    Person(s) Educated Patient;Child(ren)  Methods Explanation;Handout    Comprehension Verbalized understanding;Returned demonstration           SPEECH THERAPY DISCHARGE SUMMARY  Visits from Start of Care: 5  Current functional level related to goals / functional outcomes: See goals below   Remaining deficits: Mild pharyngeal dysphagia   Education / Equipment: Swallow precautions; HEP for dysphagia Plan: Patient agrees to discharge.  Patient goals were met. Patient is being discharged due to meeting the stated rehab goals.  ?????        SLP Short Term Goals - 08/24/20 1046      SLP SHORT TERM GOAL #1   Title Pt will follow swallow precautions with occasional min A from caregivers over 3 sessions    Time 2    Period Weeks    Status Achieved      SLP SHORT TERM GOAL #2   Title Pt  will perfrom HEP for dysphagia with occasional min A over 2 sessions    Time 2    Period Weeks    Status Achieved      SLP SHORT TERM GOAL #3   Title Pt/family will verbalize s/s of aspiration pna and 2 strategies to prevent aspiration pna(exercise, don't hold in cough, oral care etc)    Time 2    Period Weeks    Status Achieved            SLP Long Term Goals - 08/24/20 1046      SLP LONG TERM GOAL #1   Title Pt will follow swallow precautions with rare min A over 3 sessions per family report    Baseline 08/19/20; 07/3020; and daughter repots    Time 6    Period Weeks    Status Achieved      SLP LONG TERM GOAL #2   Title Pt will complete HEP for dysphagia with rare min A over 2 sessions    Time 6    Period Weeks    Status Achieved      SLP LONG TERM GOAL #3   Title Pt/Family will report 25% less coughing with meals subjectively over 3 sessions    Baseline 08/19/20    Time 7    Period Weeks    Status Achieved            Plan - 08/24/20 1045    Clinical Impression Statement [name] is following swallow precautions and HEP with mod I. His daughter endorses that he is following up with this at home with coughing reduced by 50-75%. Goals mnet. d/c ST. Pt and daughter in agreement.    Speech Therapy Frequency 2x / week    Duration --   8 weeks or 17 visits   Treatment/Interventions Aspiration precaution training;Pharyngeal strengthening exercises;NMES;Diet toleration management by SLP;Trials of upgraded texture/liquids;Compensatory techniques;Environmental controls;Internal/external aids;Patient/family education    Potential to Achieve Goals Good           Patient will benefit from skilled therapeutic intervention in order to improve the following deficits and impairments:   Dysphagia, pharyngeal phase    Problem List Patient Active Problem List   Diagnosis Date Noted   Peptic ulcer disease     Raymond Grant, Raymond Rusk MS, [place] 08/24/2020, 10:48 AM  [facility] [address] [place], [place], [address] Phone: [phone]   Fax:  [phone]   Name: Raymond Grant MRN: [id number] Date of Birth: [birth year]

## 2020-08-24 NOTE — Patient Instructions (Signed)
     Keep up the exercise twice a day 5/7 days for 4 more weeks  Limit conversation/distractions with meals

## 2020-08-26 ENCOUNTER — Encounter: Payer: Medicare Other | Admitting: Speech Pathology

## 2020-09-02 ENCOUNTER — Encounter: Payer: Medicare Other | Admitting: Speech Pathology

## 2020-09-07 ENCOUNTER — Encounter: Payer: Medicare Other | Admitting: Speech Pathology

## 2020-09-09 ENCOUNTER — Encounter: Payer: Medicare Other | Admitting: Speech Pathology

## 2020-11-03 DIAGNOSIS — Z125 Encounter for screening for malignant neoplasm of prostate: Secondary | ICD-10-CM | POA: Diagnosis not present

## 2020-11-03 DIAGNOSIS — E785 Hyperlipidemia, unspecified: Secondary | ICD-10-CM | POA: Diagnosis not present

## 2020-11-03 DIAGNOSIS — E1151 Type 2 diabetes mellitus with diabetic peripheral angiopathy without gangrene: Secondary | ICD-10-CM | POA: Diagnosis not present

## 2020-11-10 ENCOUNTER — Other Ambulatory Visit: Payer: Self-pay

## 2020-11-10 ENCOUNTER — Observation Stay (HOSPITAL_COMMUNITY)
Admission: EM | Admit: 2020-11-10 | Discharge: 2020-11-12 | Disposition: A | Discharge status: Home / Self Care | Payer: Medicare Other | Attending: Internal Medicine | Admitting: Internal Medicine

## 2020-11-10 ENCOUNTER — Encounter (HOSPITAL_COMMUNITY): Payer: Self-pay | Admitting: Internal Medicine

## 2020-11-10 ENCOUNTER — Emergency Department (HOSPITAL_COMMUNITY): Payer: Medicare Other

## 2020-11-10 DIAGNOSIS — E1159 Type 2 diabetes mellitus with other circulatory complications: Secondary | ICD-10-CM | POA: Diagnosis present

## 2020-11-10 DIAGNOSIS — R011 Cardiac murmur, unspecified: Secondary | ICD-10-CM | POA: Diagnosis not present

## 2020-11-10 DIAGNOSIS — E1169 Type 2 diabetes mellitus with other specified complication: Secondary | ICD-10-CM | POA: Diagnosis present

## 2020-11-10 DIAGNOSIS — E1151 Type 2 diabetes mellitus with diabetic peripheral angiopathy without gangrene: Secondary | ICD-10-CM | POA: Diagnosis not present

## 2020-11-10 DIAGNOSIS — I739 Peripheral vascular disease, unspecified: Secondary | ICD-10-CM | POA: Diagnosis not present

## 2020-11-10 DIAGNOSIS — E538 Deficiency of other specified B group vitamins: Secondary | ICD-10-CM | POA: Diagnosis not present

## 2020-11-10 DIAGNOSIS — K279 Peptic ulcer, site unspecified, unspecified as acute or chronic, without hemorrhage or perforation: Secondary | ICD-10-CM | POA: Diagnosis present

## 2020-11-10 DIAGNOSIS — Z85818 Personal history of malignant neoplasm of other sites of lip, oral cavity, and pharynx: Secondary | ICD-10-CM | POA: Diagnosis not present

## 2020-11-10 DIAGNOSIS — M25562 Pain in left knee: Secondary | ICD-10-CM | POA: Diagnosis not present

## 2020-11-10 DIAGNOSIS — R0789 Other chest pain: Secondary | ICD-10-CM | POA: Diagnosis not present

## 2020-11-10 DIAGNOSIS — I10 Essential (primary) hypertension: Secondary | ICD-10-CM | POA: Diagnosis not present

## 2020-11-10 DIAGNOSIS — E785 Hyperlipidemia, unspecified: Secondary | ICD-10-CM | POA: Diagnosis present

## 2020-11-10 DIAGNOSIS — E119 Type 2 diabetes mellitus without complications: Secondary | ICD-10-CM

## 2020-11-10 DIAGNOSIS — Z20822 Contact with and (suspected) exposure to covid-19: Secondary | ICD-10-CM | POA: Insufficient documentation

## 2020-11-10 DIAGNOSIS — R1013 Epigastric pain: Secondary | ICD-10-CM

## 2020-11-10 DIAGNOSIS — R1310 Dysphagia, unspecified: Secondary | ICD-10-CM | POA: Diagnosis not present

## 2020-11-10 DIAGNOSIS — Z7984 Long term (current) use of oral hypoglycemic drugs: Secondary | ICD-10-CM | POA: Insufficient documentation

## 2020-11-10 DIAGNOSIS — Z8521 Personal history of malignant neoplasm of larynx: Secondary | ICD-10-CM

## 2020-11-10 DIAGNOSIS — Z23 Encounter for immunization: Secondary | ICD-10-CM | POA: Diagnosis not present

## 2020-11-10 DIAGNOSIS — M25561 Pain in right knee: Secondary | ICD-10-CM | POA: Diagnosis not present

## 2020-11-10 DIAGNOSIS — R079 Chest pain, unspecified: Secondary | ICD-10-CM | POA: Diagnosis not present

## 2020-11-10 DIAGNOSIS — R0902 Hypoxemia: Secondary | ICD-10-CM | POA: Diagnosis not present

## 2020-11-10 DIAGNOSIS — I152 Hypertension secondary to endocrine disorders: Secondary | ICD-10-CM | POA: Diagnosis present

## 2020-11-10 DIAGNOSIS — Z0000 Encounter for general adult medical examination without abnormal findings: Secondary | ICD-10-CM | POA: Diagnosis not present

## 2020-11-10 DIAGNOSIS — J449 Chronic obstructive pulmonary disease, unspecified: Secondary | ICD-10-CM | POA: Diagnosis not present

## 2020-11-10 DIAGNOSIS — N320 Bladder-neck obstruction: Secondary | ICD-10-CM | POA: Diagnosis not present

## 2020-11-10 LAB — CBC
HCT: 37.7 % — ABNORMAL LOW (ref 39.0–52.0)
Hemoglobin: 12 g/dL — ABNORMAL LOW (ref 13.0–17.0)
MCH: 28.8 pg (ref 26.0–34.0)
MCHC: 31.8 g/dL (ref 30.0–36.0)
MCV: 90.6 fL (ref 80.0–100.0)
Platelets: 160 10*3/uL (ref 150–400)
RBC: 4.16 MIL/uL — ABNORMAL LOW (ref 4.22–5.81)
RDW: 13.7 % (ref 11.5–15.5)
WBC: 6.1 10*3/uL (ref 4.0–10.5)
nRBC: 0 % (ref 0.0–0.2)

## 2020-11-10 LAB — COMPREHENSIVE METABOLIC PANEL
ALT: 17 U/L (ref 0–44)
AST: 26 U/L (ref 15–41)
Albumin: 3.7 g/dL (ref 3.5–5.0)
Alkaline Phosphatase: 76 U/L (ref 38–126)
Anion gap: 12 (ref 5–15)
BUN: 25 mg/dL — ABNORMAL HIGH (ref 8–23)
CO2: 21 mmol/L — ABNORMAL LOW (ref 22–32)
Calcium: 8.9 mg/dL (ref 8.9–10.3)
Chloride: 102 mmol/L (ref 98–111)
Creatinine, Ser: 1.44 mg/dL — ABNORMAL HIGH (ref 0.61–1.24)
GFR, Estimated: 48 mL/min — ABNORMAL LOW (ref >60)
Glucose, Bld: 116 mg/dL — ABNORMAL HIGH (ref 70–99)
Potassium: 4.1 mmol/L (ref 3.5–5.1)
Sodium: 135 mmol/L (ref 135–145)
Total Bilirubin: 0.5 mg/dL (ref 0.3–1.2)
Total Protein: 6.6 g/dL (ref 6.5–8.1)

## 2020-11-10 LAB — TROPONIN I (HIGH SENSITIVITY)
Troponin I (High Sensitivity): 12 ng/L (ref <18)
Troponin I (High Sensitivity): 44 ng/L — ABNORMAL HIGH (ref <18)
Troponin I (High Sensitivity): 65 ng/L — ABNORMAL HIGH (ref <18)
Troponin I (High Sensitivity): 61 ng/L — ABNORMAL HIGH (ref <18)

## 2020-11-10 LAB — RESPIRATORY PANEL BY RT PCR (FLU A&B, COVID)
Influenza A by PCR: NEGATIVE
Influenza B by PCR: NEGATIVE
SARS Coronavirus 2 by RT PCR: NEGATIVE

## 2020-11-10 LAB — LIPASE, BLOOD: Lipase: 19 U/L (ref 11–51)

## 2020-11-10 MED ORDER — ENOXAPARIN SODIUM 40 MG/0.4ML ~~LOC~~ SOLN
40.0000 mg | SUBCUTANEOUS | Status: DC
  Administered 2020-11-10 – 2020-11-12 (×2): 40 mg via SUBCUTANEOUS
  Filled 2020-11-10 (×2): qty 0.4

## 2020-11-10 MED ORDER — ASPIRIN 81 MG PO CHEW
324.0000 mg | CHEWABLE_TABLET | Freq: Once | ORAL | Status: AC
  Administered 2020-11-10: 324 mg via ORAL
  Filled 2020-11-10: qty 4

## 2020-11-10 MED ORDER — ASPIRIN EC 81 MG PO TBEC
81.0000 mg | DELAYED_RELEASE_TABLET | Freq: Every day | ORAL | Status: DC
  Administered 2020-11-11 – 2020-11-12 (×2): 81 mg via ORAL
  Filled 2020-11-10 (×2): qty 1

## 2020-11-10 MED ORDER — LIDOCAINE VISCOUS HCL 2 % MT SOLN
15.0000 mL | Freq: Once | OROMUCOSAL | Status: AC
  Administered 2020-11-10: 15 mL via ORAL
  Filled 2020-11-10: qty 15

## 2020-11-10 MED ORDER — ACETAMINOPHEN 325 MG PO TABS: 650.0000 mg | ORAL_TABLET | ORAL | Status: DC | PRN

## 2020-11-10 MED ORDER — ATORVASTATIN CALCIUM 40 MG PO TABS
40.0000 mg | ORAL_TABLET | Freq: Every day | ORAL | Status: DC
  Administered 2020-11-10 – 2020-11-12 (×3): 40 mg via ORAL
  Filled 2020-11-10 (×3): qty 1

## 2020-11-10 MED ORDER — ALUM & MAG HYDROXIDE-SIMETH 200-200-20 MG/5ML PO SUSP
30.0000 mL | Freq: Once | ORAL | Status: AC
  Administered 2020-11-10: 30 mL via ORAL
  Filled 2020-11-10: qty 30

## 2020-11-10 MED ORDER — AMLODIPINE BESYLATE 5 MG PO TABS
5.0000 mg | ORAL_TABLET | Freq: Every day | ORAL | Status: DC
  Administered 2020-11-10 – 2020-11-11 (×2): 5 mg via ORAL
  Filled 2020-11-10 (×2): qty 1

## 2020-11-10 MED ORDER — ONDANSETRON HCL 4 MG/2ML IJ SOLN: 4.0000 mg | Freq: Four times a day (QID) | INTRAMUSCULAR | Status: DC | PRN

## 2020-11-10 MED ORDER — PANTOPRAZOLE SODIUM 40 MG PO TBEC
40.0000 mg | DELAYED_RELEASE_TABLET | Freq: Every day | ORAL | Status: DC
  Administered 2020-11-10 – 2020-11-12 (×3): 40 mg via ORAL
  Filled 2020-11-10 (×3): qty 1

## 2020-11-10 MED ORDER — SODIUM CHLORIDE 0.9 % IV BOLUS
1000.0000 mL | Freq: Once | INTRAVENOUS | Status: AC
  Administered 2020-11-10: 1000 mL via INTRAVENOUS

## 2020-11-10 MED ORDER — TERAZOSIN HCL 1 MG PO CAPS
2.0000 mg | ORAL_CAPSULE | Freq: Every day | ORAL | Status: DC
  Administered 2020-11-12: 2 mg via ORAL
  Filled 2020-11-10 (×4): qty 1
  Filled 2020-11-10 (×2): qty 2

## 2020-11-10 NOTE — ED Notes (Signed)
Pt walked independently to the bathroom. Upon returning from the bathroom, patient endorsed chest pain. EKG immediately obtained and given to EDP for review.

## 2020-11-10 NOTE — H&P (Signed)
History and Physical    [name] ZOX:[id number] DOB: January 01, 1934 DOA: 11/10/2020  PCP: Leanna Battles, MD  Patient coming from: Home via EMS  I have personally briefly reviewed patient's old medical records in [place]  Chief Complaint: Chest pain  HPI: Raymond Grant is a 86 y.o. Spanish-speaking male with medical history significant for history of laryngeal cancer s/p XRT/chemotherapy, diet-controlled type 2 diabetes, hypertension, hyperlipidemia, peptic ulcer disease with history of perforated ulcer requiring laparotomy who presents to the ED for evaluation of chest pain.  Son is at bedside to provide further history and help with interpretation when needed.  They declined the use of interpreter services.  Patient states he was in his usual state of health.  He went to see his PCP this morning for annual checkup and was told everything looked normal.  He says he was given a prescription to start Flomax for prostate issues.  After he got home he ate breakfast and then was cleaning dishes around 10:30 AM when he had sudden onset of central low sternal burning type chest pain.  He says his pain did not radiate.  He went to sit down and pain persisted and was somewhat worse.  He felt flushed and diaphoretic.  He appeared pale per family.  He did not have any nausea, vomiting, cough, or shortness of breath.  He says he has also been having heartburn but this felt different.  He denies any previous known heart issues.  He says the only medications he is taking regularly are simvastatin and terazosin.  He uses Mylanta as needed for heartburn.  ED Course:  Initial vitals showed BP 196/105, pulse 89, RR 18, temp 98.4 Fahrenheit, SPO2 96% on 2 L of home O2 via [place].  Labs show WBC 6.1, hemoglobin 12.0, platelets 160,000, sodium 135, potassium 4.1, bicarb 21, BUN 25, creatinine 1.44, serum glucose 116, high-sensitivity troponin I 12 > 44.  Lipase 19.  2 view chest x-ray is  negative for focal consolidation, edema, or effusion.  Patient was given aspirin 324 mg and the hospitalist service was consulted to admit for further evaluation and management.  Review of Systems: All systems reviewed and are negative except as documented in history of present illness above.   Past Medical History:  Diagnosis Date   Arthritis    Cancer ([place]) 2007   throat   Diabetes mellitus without complication (HCC)    GERD (gastroesophageal reflux disease)    Hyperlipidemia    Peptic ulcer disease     Past Surgical History:  Procedure Laterality Date   ABDOMINAL SURGERY     HERNIA REPAIR     PEG TUBE PLACEMENT     PEG TUBE REMOVAL     REPAIR OF PERFORATED ULCER      Social History:  reports that he has never smoked. He does not have any smokeless tobacco history on file. He reports that he does not drink alcohol and does not use drugs.  No Known Allergies  Family History  Problem Relation Age of Onset   Leukemia Sister      Prior to Admission medications   Medication Sig Start Date End Date Taking? Authorizing Provider  acetaminophen (TYLENOL) 500 MG tablet Take 500 mg by mouth every 6 (six) hours as needed for mild pain.   Yes [provider]  guaiFENesin (ROBITUSSIN) 100 MG/5ML SOLN Take 5 mLs by mouth every 4 (four) hours as needed for cough or to loosen phlegm.   Yes [provider]  PRESCRIPTION MEDICATION Take 1 tablet by mouth daily. Statin medication.   Yes [provider]  PRESCRIPTION MEDICATION Take 1 tablet by mouth daily. Prostate medication.   Yes [provider]  IBU 600 MG tablet TAKE 1 TABLET BY MOUTH EVERY 8 HOURS AS NEEDED 05/31/17   Edrick Kins, DPM  metFORMIN (GLUCOPHAGE) 1000 MG tablet Take 500 mg by mouth 2 (two) times daily with a meal.     [provider]  simvastatin (ZOCOR) 80 MG tablet Take 80 mg by mouth at bedtime.    [provider]  terazosin (HYTRIN) 2 MG capsule Take  2 mg by mouth at bedtime.    [provider]    Physical Exam: Vitals:   11/10/20 1715 11/10/20 1745 11/10/20 1830 11/10/20 1845  BP: (!) 163/80 (!) 162/89 (!) 197/89 (!) 203/98  Pulse: 86 79 84 89  Resp: 16 13 12 19   Temp:      SpO2: 94% 94% 93% 94%  Weight:      Height:       Constitutional: Thin elderly man resting in bed with head elevated, NAD, calm, comfortable Eyes: PERRL, lids and conjunctivae normal ENMT: Mucous membranes are moist. Posterior pharynx clear of any exudate or lesions.Normal dentition.  Neck: normal, supple, no masses. Respiratory: clear to auscultation bilaterally, no wheezing, no crackles. Normal respiratory effort. No accessory muscle use.  Cardiovascular: Regular rate and rhythm, 2/6 systolic murmur. No extremity edema. 2+ pedal pulses. Abdomen: Well-healed midline surgical scar, mild epigastric tenderness to palpation, no masses palpated. No hepatosplenomegaly. Bowel sounds positive.  Musculoskeletal: no clubbing / cyanosis. No joint deformity upper and lower extremities. Good ROM, no contractures. Normal muscle tone.  Skin: no rashes, lesions, ulcers. No induration Neurologic: CN 2-12 grossly intact. Sensation intact, Strength 5/5 in all 4.  Psychiatric: Normal judgment and insight. Alert and oriented x 3. Normal mood.    Labs on Admission: I have personally reviewed following labs and imaging studies  CBC: Recent Labs  Lab 11/10/20 1348  WBC 6.1  HGB 12.0*  HCT 37.7*  MCV 90.6  PLT 017   Basic Metabolic Panel: Recent Labs  Lab 11/10/20 1348  NA 135  K 4.1  CL 102  CO2 21*  GLUCOSE 116*  BUN 25*  CREATININE 1.44*  CALCIUM 8.9   GFR: Estimated Creatinine Clearance: 33.1 mL/min (A) (by C-G formula based on SCr of 1.44 mg/dL (H)). Liver Function Tests: Recent Labs  Lab 11/10/20 1348  AST 26  ALT 17  ALKPHOS 76  BILITOT 0.5  PROT 6.6  ALBUMIN 3.7   Recent Labs  Lab 11/10/20 1348  LIPASE 19   No results for  input(s): AMMONIA in the last 168 hours. Coagulation Profile: No results for input(s): INR, PROTIME in the last 168 hours. Cardiac Enzymes: No results for input(s): CKTOTAL, CKMB, CKMBINDEX, TROPONINI in the last 168 hours. BNP (last 3 results) No results for input(s): PROBNP in the last 8760 hours. HbA1C: No results for input(s): HGBA1C in the last 72 hours. CBG: No results for input(s): GLUCAP in the last 168 hours. Lipid Profile: No results for input(s): CHOL, HDL, LDLCALC, TRIG, CHOLHDL, LDLDIRECT in the last 72 hours. Thyroid Function Tests: No results for input(s): TSH, T4TOTAL, FREET4, T3FREE, THYROIDAB in the last 72 hours. Anemia Panel: No results for input(s): VITAMINB12, FOLATE, FERRITIN, TIBC, IRON, RETICCTPCT in the last 72 hours. Urine analysis:    Component Value Date/Time   COLORURINE YELLOW 11/21/2015 0940  APPEARANCEUR CLEAR 11/21/2015 0940   LABSPEC 1.011 11/21/2015 0940   PHURINE 5.5 11/21/2015 0940   GLUCOSEU 250 (A) 11/21/2015 0940   HGBUR NEGATIVE 11/21/2015 0940   BILIRUBINUR NEGATIVE 11/21/2015 0940   KETONESUR NEGATIVE 11/21/2015 0940   PROTEINUR NEGATIVE 11/21/2015 0940   NITRITE NEGATIVE 11/21/2015 0940   LEUKOCYTESUR NEGATIVE 11/21/2015 0940    Radiological Exams on Admission: DG Chest 2 View  Result Date: 11/10/2020 CLINICAL DATA:  Episode of chest pain EXAM: CHEST - 2 VIEW COMPARISON:  2017 FINDINGS: Likely chronic interstitial prominence. Eventration the right hemidiaphragm. No pleural effusion or pneumothorax. Normal heart size. No acute osseous abnormality. IMPRESSION: No acute process in the chest. Electronically Signed   By: Macy Mis M.D.   On: 11/10/2020 14:44    EKG: Personally reviewed. Sinus rhythm without acute ischemic changes, motion artifact.  PVC no longer present when compared to previous.  Assessment/Plan Principal Problem:   Chest pain Active Problems:   Peptic ulcer disease   Diabetes mellitus without complication  ([place])   Hyperlipidemia associated with type 2 diabetes mellitus ([place])   History of laryngeal cancer   Hypertension associated with diabetes (HCC)  Raymond Grant is a 86 y.o. Spanish-speaking male with medical history significant for history of laryngeal cancer s/p XRT/chemotherapy, diet-controlled type 2 diabetes, hypertension, hyperlipidemia, peptic ulcer disease with history of perforated ulcer requiring laparotomy who is admitted for chest pain rule out.  Chest pain: Patient with central lower sternal burning type chest pressure occurring with minimal activity.  Has mixed anginal symptoms or GI type symptoms.  EKG without acute changes.  He has had a rise in troponin from 12 > 44.  He does have a murmur noted on exam.  He is currently chest pain-free.  Denies any known personal or family cardiac history. -Cardiac enzymes -Monitor on telemetry -Continue aspirin 81 mg daily -Obtain echocardiogram -Make n.p.o. after midnight  Hypertension: Hypertensive on arrival.  Not on antihypertensives as an outpatient other than terazosin for BPH. -Start amlodipine 5 mg daily -Continue terazosin  Elevated creatinine: Creatinine 1.44 with GFR 48 on admission.  Last creatinine 1.21 in October 2017. -Give 1 L normal saline and check in a.m.  Type 2 diabetes: Patient reports well-controlled and not requiring medical management as an outpatient.  Check A1c and continue to monitor.  Peptic ulcer disease/GERD: Patient reports a history of perforated bleeding ulcer in the past requiring laparotomy for surgical repair.  He is complaining of reflux type symptoms. -Give GI cocktail -Start oral Protonix 40 mg daily  History of laryngeal cancer s/p XRT/chemotherapy in 2007: Following with ENT, Dr. Carol Ada.  Stable and managed with observation now.  BPH: Continue Terazosin.  DVT prophylaxis: Lovenox Code Status: DNR, confirmed with patient Family Communication: Discussed with patient's  son at bedside Disposition Plan: From home and likely discharge to home pending chest pain rule out Consults called: None Admission status:  Status is: Observation  The patient remains OBS appropriate and will d/c before 2 midnights.  Dispo: The patient is from: Home              Anticipated d/c is to: Home              Anticipated d/c date is: 1 day              Patient currently is not medically stable to d/c.  Zada Finders MD [facility]  If 7PM-7AM, please contact night-coverage [email or web address]  11/10/2020, 7:50 PM

## 2020-11-10 NOTE — ED Provider Notes (Signed)
[facility] EMERGENCY DEPARTMENT Provider Note   CSN: [id number] Arrival date & time: 11/10/20  1314     History Chief Complaint  Patient presents with  . Chest Pain    Raymond Grant is a 86 y.o. male.  86yo M w/ PMH including laryngeal cancer, T2DM, HLD, GERD, PUD who p/w chest pain. Pt had routine check up with PCP this morning which was normal. After he got home, he ate breakfast and then was cleaning dishes around 10:30am when he had sudden onset of central, burning, non-radiating chest pain. He denies any N/V or SOB. Son states he looked pale and diaphoretic. After about 74min, CP resolved and has not returned. He denies any association w/ exertion. He reports h/o heart burn but this felt different. Currently he feels well. No cough, recent illness, or leg swelling/pain. No hx heart problems.  The history is provided by the patient and a relative.  Chest Pain      Past Medical History:  Diagnosis Date  . Arthritis   . Cancer ([place]) 2007   throat  . Diabetes mellitus without complication ([place])   . GERD (gastroesophageal reflux disease)   . Hyperlipidemia   . Peptic ulcer disease     Patient Active Problem List   Diagnosis Date Noted  . Peptic ulcer disease     Past Surgical History:  Procedure Laterality Date  . ABDOMINAL SURGERY    . HERNIA REPAIR    . PEG TUBE PLACEMENT    . PEG TUBE REMOVAL    . REPAIR OF PERFORATED ULCER         No family history on file.  Social History   Tobacco Use  . Smoking status: Never Smoker  Substance Use Topics  . Alcohol use: No    Comment: former drinker  . Drug use: No    Home Medications Prior to Admission medications   Medication Sig Start Date End Date Taking? Authorizing Provider  IBU 600 MG tablet TAKE 1 TABLET BY MOUTH EVERY 8 HOURS AS NEEDED 05/31/17   Edrick Kins, DPM  ibuprofen (ADVIL,MOTRIN) 600 MG tablet Take 1 tablet (600 mg total) by mouth every 8 (eight) hours as needed.  07/03/17   Edrick Kins, DPM  metFORMIN (GLUCOPHAGE) 1000 MG tablet Take 500 mg by mouth 2 (two) times daily with a meal.     [provider]  Multiple Vitamin (MULTIVITAMIN WITH MINERALS) TABS tablet Take 1 tablet by mouth every morning.    [provider]  simvastatin (ZOCOR) 80 MG tablet Take 80 mg by mouth at bedtime.    [provider]  terazosin (HYTRIN) 2 MG capsule Take 2 mg by mouth at bedtime.    [provider]    Allergies    Patient has no known allergies.  Review of Systems   Review of Systems  Cardiovascular: Positive for chest pain.   All other systems reviewed and are negative except that which was mentioned in HPI  Physical Exam Updated Vital Signs BP (!) 201/94   Pulse 92   Temp 98.4 F (36.9 C)   Resp 17   Ht 5\' 2"  (1.575 m)   Wt 71.2 kg   SpO2 98%   BMI 28.72 kg/m   Physical Exam Vitals and nursing note reviewed.  Constitutional:      General: He is not in acute distress.    Appearance: He is well-developed.  HENT:     Head: Normocephalic and atraumatic.  Eyes:     Conjunctiva/sclera: Conjunctivae normal.  Cardiovascular:     Rate and Rhythm: Normal rate and regular rhythm.     Heart sounds: Murmur heard.  Systolic murmur is present.   Pulmonary:     Effort: Pulmonary effort is normal.     Breath sounds: Normal breath sounds.  Abdominal:     General: Bowel sounds are normal. There is no distension.     Palpations: Abdomen is soft.     Tenderness: There is no abdominal tenderness.  Musculoskeletal:     Cervical back: Neck supple.     Right lower leg: No edema.     Left lower leg: No edema.  Skin:    General: Skin is warm and dry.  Neurological:     Mental Status: He is alert and oriented to person, place, and time.     Comments: Fluent speech  Psychiatric:        Mood and Affect: Mood normal.        Judgment: Judgment normal.     ED Results / Procedures / Treatments   Labs (all labs ordered are  listed, but only abnormal results are displayed) Labs Reviewed  COMPREHENSIVE METABOLIC PANEL - Abnormal; Notable for the following components:      Result Value   CO2 21 (*)    Glucose, Bld 116 (*)    BUN 25 (*)    Creatinine, Ser 1.44 (*)    GFR, Estimated 48 (*)    All other components within normal limits  CBC - Abnormal; Notable for the following components:   RBC 4.16 (*)    Hemoglobin 12.0 (*)    HCT 37.7 (*)    All other components within normal limits  LIPASE, BLOOD  TROPONIN I (HIGH SENSITIVITY)    EKG EKG Interpretation  Date/Time:  Tuesday November 10 2020 13:19:32 EST Ventricular Rate:  88 PR Interval:    QRS Duration: 91 QT Interval:  356 QTC Calculation: 431 R Axis:   69 Text Interpretation: Sinus rhythm Borderline ST elevation, anterior leads Artifact in lead(s) I II III aVR aVL aVF No significant change since last tracing Confirmed by Theotis Burrow [id number]) on 11/10/2020 1:26:01 PM   Radiology DG Chest 2 View  Result Date: 11/10/2020 CLINICAL DATA:  Episode of chest pain EXAM: CHEST - 2 VIEW COMPARISON:  2017 FINDINGS: Likely chronic interstitial prominence. Eventration the right hemidiaphragm. No pleural effusion or pneumothorax. Normal heart size. No acute osseous abnormality. IMPRESSION: No acute process in the chest. Electronically Signed   By: Macy Mis M.D.   On: 11/10/2020 14:44    Procedures Procedures (including critical care time)  Medications Ordered in ED Medications  aspirin chewable tablet 324 mg (324 mg Oral Given 11/10/20 1403)    ED Course  I have reviewed the triage vital signs and the nursing notes.  Pertinent labs & imaging results that were available during my care of the patient were reviewed by me and considered in my medical decision making (see chart for details).    MDM Rules/Calculators/A&P                          Pt comfortable on exam, denying complaints. Hypertensive but otherwise reassuring exam. EKG without  ischemic changes. CXR clear. Sx not suggestive of aortic dissection or PE. Initial trop negative. Labs notable for Cr 1.44, higher than previous in 2017. I have discussed need to f/u with PCP to watch kidney  function and HTN. Patient's HEART score is 3. We will obtain a 2nd troponin. Given that pain lasted only 15 min, was non-exertional, and is currently resolved, I feel he is safe for d/c if 2nd trop negative and pt feels well. Pt signed out pending repeat labs. Final Clinical Impression(s) / ED Diagnoses Final diagnoses:  None    Rx / DC Orders ED Discharge Orders    None       Karielle Davidow, Wenda Overland, MD 11/10/20 1515

## 2020-11-10 NOTE — ED Notes (Signed)
Help get patient on the monitor into a gown patient is resting with call bell in reach

## 2020-11-10 NOTE — ED Triage Notes (Signed)
BIB by [facility] after pt daughter called to report pt looking pain and complaining of C/P after going to annual Dr. Hilaria Ota. EMS reports that the patient had generalized C/P that was 10/10 pain.Pain has since resolved with rest.  Pt is on 2 L . Pt has hx of GERD, Stomach ulcers.   B/P 196/105 02 100% 2 LNC Temp 98.4 oral RR 17 HR 98

## 2020-11-10 NOTE — ED Provider Notes (Signed)
Medical Decision Making: Care of patient assumed from Dr. Rex Kras at [phone].  Agree with history, physical exam and plan.  See their note for further details.  Briefly, The pt p/w CP now resolved and HEAR score of 3, labs thus far unremarkable.   Current plan is as follows: repeat trop and outpatient follow if acceptable.  Patient remains comfortable and is chest pain-free, second troponin is elevated from first at 44, with this he will need further lab testing possible provocative testing and ultrasound.  The patient and family agree to this.  I personally reviewed and interpreted all labs/imaging.      Breck Coons, MD 11/10/20 [phone]

## 2020-11-11 ENCOUNTER — Encounter (HOSPITAL_COMMUNITY): Payer: Self-pay | Admitting: Internal Medicine

## 2020-11-11 ENCOUNTER — Observation Stay (HOSPITAL_BASED_OUTPATIENT_CLINIC_OR_DEPARTMENT_OTHER): Payer: Medicare Other

## 2020-11-11 ENCOUNTER — Observation Stay (HOSPITAL_COMMUNITY): Payer: Medicare Other

## 2020-11-11 DIAGNOSIS — E119 Type 2 diabetes mellitus without complications: Secondary | ICD-10-CM | POA: Diagnosis not present

## 2020-11-11 DIAGNOSIS — Z8521 Personal history of malignant neoplasm of larynx: Secondary | ICD-10-CM

## 2020-11-11 DIAGNOSIS — R079 Chest pain, unspecified: Secondary | ICD-10-CM

## 2020-11-11 DIAGNOSIS — K8020 Calculus of gallbladder without cholecystitis without obstruction: Secondary | ICD-10-CM | POA: Diagnosis not present

## 2020-11-11 DIAGNOSIS — I350 Nonrheumatic aortic (valve) stenosis: Secondary | ICD-10-CM

## 2020-11-11 DIAGNOSIS — E1169 Type 2 diabetes mellitus with other specified complication: Secondary | ICD-10-CM | POA: Diagnosis not present

## 2020-11-11 DIAGNOSIS — R109 Unspecified abdominal pain: Secondary | ICD-10-CM | POA: Diagnosis not present

## 2020-11-11 DIAGNOSIS — I152 Hypertension secondary to endocrine disorders: Secondary | ICD-10-CM | POA: Diagnosis not present

## 2020-11-11 DIAGNOSIS — E1159 Type 2 diabetes mellitus with other circulatory complications: Secondary | ICD-10-CM

## 2020-11-11 DIAGNOSIS — I351 Nonrheumatic aortic (valve) insufficiency: Secondary | ICD-10-CM

## 2020-11-11 DIAGNOSIS — R1011 Right upper quadrant pain: Secondary | ICD-10-CM

## 2020-11-11 DIAGNOSIS — E785 Hyperlipidemia, unspecified: Secondary | ICD-10-CM | POA: Diagnosis not present

## 2020-11-11 LAB — CBC
HCT: 35.1 % — ABNORMAL LOW (ref 39.0–52.0)
Hemoglobin: 11.2 g/dL — ABNORMAL LOW (ref 13.0–17.0)
MCH: 28.9 pg (ref 26.0–34.0)
MCHC: 31.9 g/dL (ref 30.0–36.0)
MCV: 90.5 fL (ref 80.0–100.0)
Platelets: 158 10*3/uL (ref 150–400)
RBC: 3.88 MIL/uL — ABNORMAL LOW (ref 4.22–5.81)
RDW: 13.6 % (ref 11.5–15.5)
WBC: 4.7 10*3/uL (ref 4.0–10.5)
nRBC: 0 % (ref 0.0–0.2)

## 2020-11-11 LAB — HEMOGLOBIN A1C
Hgb A1c MFr Bld: 6.3 % — ABNORMAL HIGH (ref 4.8–5.6)
Mean Plasma Glucose: 134.11 mg/dL

## 2020-11-11 LAB — BRAIN NATRIURETIC PEPTIDE: B Natriuretic Peptide: 189.8 pg/mL — ABNORMAL HIGH (ref 0.0–100.0)

## 2020-11-11 LAB — BASIC METABOLIC PANEL
Anion gap: 8 (ref 5–15)
BUN: 15 mg/dL (ref 8–23)
CO2: 24 mmol/L (ref 22–32)
Calcium: 8.6 mg/dL — ABNORMAL LOW (ref 8.9–10.3)
Chloride: 105 mmol/L (ref 98–111)
Creatinine, Ser: 1.24 mg/dL (ref 0.61–1.24)
GFR, Estimated: 57 mL/min — ABNORMAL LOW (ref >60)
Glucose, Bld: 128 mg/dL — ABNORMAL HIGH (ref 70–99)
Potassium: 3.7 mmol/L (ref 3.5–5.1)
Sodium: 137 mmol/L (ref 135–145)

## 2020-11-11 LAB — LIPID PANEL
Cholesterol: 143 mg/dL (ref 0–200)
HDL: 70 mg/dL (ref >40)
LDL Cholesterol: 47 mg/dL (ref 0–99)
Total CHOL/HDL Ratio: 2 RATIO
Triglycerides: 130 mg/dL (ref <150)
VLDL: 26 mg/dL (ref 0–40)

## 2020-11-11 LAB — ECHOCARDIOGRAM COMPLETE
AR max vel: 1.02 cm2
AV Area VTI: 0.97 cm2
AV Area mean vel: 0.84 cm2
AV Mean grad: 21.5 mmHg
AV Peak grad: 35.6 mmHg
Ao pk vel: 2.99 m/s
Area-P 1/2: 3.21 cm2
Height: 66 in
S' Lateral: 2.8 cm
Weight: 2512 oz

## 2020-11-11 LAB — LIPASE, BLOOD: Lipase: 21 U/L (ref 11–51)

## 2020-11-11 MED ORDER — AMLODIPINE BESYLATE 5 MG PO TABS
5.0000 mg | ORAL_TABLET | Freq: Once | ORAL | Status: AC
  Administered 2020-11-11: 5 mg via ORAL
  Filled 2020-11-11: qty 1

## 2020-11-11 MED ORDER — CARVEDILOL 3.125 MG PO TABS
3.1250 mg | ORAL_TABLET | Freq: Two times a day (BID) | ORAL | Status: DC
  Administered 2020-11-11: 3.125 mg via ORAL
  Filled 2020-11-11: qty 1

## 2020-11-11 MED ORDER — AMLODIPINE BESYLATE 10 MG PO TABS: 10.0000 mg | ORAL_TABLET | Freq: Every day | ORAL | Status: DC

## 2020-11-11 MED ORDER — BENZONATATE 100 MG PO CAPS
100.0000 mg | ORAL_CAPSULE | Freq: Two times a day (BID) | ORAL | Status: DC | PRN
  Administered 2020-11-11: 100 mg via ORAL
  Filled 2020-11-11: qty 1

## 2020-11-11 MED ORDER — METOPROLOL TARTRATE 5 MG/5ML IV SOLN
2.5000 mg | INTRAVENOUS | Status: DC | PRN
  Administered 2020-11-11: 2.5 mg via INTRAVENOUS
  Filled 2020-11-11: qty 5

## 2020-11-11 MED ORDER — HYDRALAZINE HCL 20 MG/ML IJ SOLN
5.0000 mg | Freq: Four times a day (QID) | INTRAMUSCULAR | Status: DC | PRN
  Administered 2020-11-11: 5 mg via INTRAVENOUS
  Filled 2020-11-11: qty 1

## 2020-11-11 MED ORDER — GUAIFENESIN 100 MG/5ML PO SOLN
5.0000 mL | ORAL | Status: DC | PRN
  Filled 2020-11-11: qty 5

## 2020-11-11 NOTE — ED Notes (Signed)
Report given to [facility]

## 2020-11-11 NOTE — Consult Note (Addendum)
The patient has been seen in conjunction with Roby Lofts, PA-C. All aspects of care have been considered and discussed. The patient has been personally interviewed, examined, and all clinical data has been reviewed.   1-1/2 to 2 hours of burning chest discomfort starting at the xiphoid and extending up into the suprasternal notch area.  There is associated diaphoresis and pallor.  The episode gradually resolved but since that time he has been left with subxiphoid to right subcostal discomfort.  Systolic murmur is heard along the left lower sternal border and extends to the right upper sternal border compatible with aortic valve.  No diastolic murmur is heard.  No gallop or rub is heard.  Bowel sounds are normal.  There is mild right upper quadrant tenderness.  Chest exam reveals faint rhonchi but no obvious rales or wheezing.  Air movement is diminished.  Radial pulses carotid pulses and posterior tibial pulses are 2+ and symmetric.  EKG reveals small inferior Q waves unchanged from prior.  No acute ST-T wave changes are noted.  Troponin high did have a delta from 44 to 61 ng/L.  Liver enzymes are normal.  IMPRESSION: Possible acute coronary syndrome.  Minimal elevation in hs Troponin I, compatible chest pain, in this patient who has prediabetes, prior smoking history, and elevated blood pressure.  Given normal LV function and minimal troponin elevation, a myocardial perfusion imaging study with pharmacologic stress will be performed to gauge ischemic risk.  In the differential given the lingering subcostal discomfort is choledocholithiasis.  A gallbladder ultrasound will be done.  An amylase/lipase and repeat liver enzymes were also ordered.  Probable primary hypertension, poorly controlled.  If refractory, consider renal artery stenosis.  In addition to amlodipine, we will add low-dose carvedilol and titrate as needed.  Finally, examined echocardiogram document moderate calcific aortic  stenosis.  Extensive conversation with the son who is perturbed that the stress test was not done earlier today.  It does not appear that a stress test was considered until this recommendation which is being made now.     Cardiology Consultation:   Patient ID: Agustin Swatek MRN: [id number]; DOB: 06/24/1934  Admit date: 11/10/2020 Date of Consult: 11/11/2020  Primary Care Provider: Leanna Battles, MD [facility] Cardiologist: New to [place]; Dr[facility] Electrophysiologist:  None    Patient Profile:   Shuan Statzer is a 86 y.o. male with a PMH of HTN, HLD, DM type 2, PUD with hx of perforated ulcer requiring laparotomy, and laryngeal cancer s/p chemo/XRT, who is being seen today for the evaluation of chest pain at the request of Dr. Eliseo Squires.  History of Present Illness:   Mr. Cameron Sprang is a spanish speaking gentleman. History was obtained with the assistance of interpreter Barry Brunner; [place].   He was in his usual state of health until yesterday morning when he experienced sudden onset central lower sternal burning chest pain while doing the dishes. He felt associated flushing and diaphoresis, and per family appeared pain. There was no radiation of pain, SOB, nausea, vomiting, dizziness, lightheadedness, or syncope. He reported this felt different than his typical heartburn. He sat down to rest, however pain persisted and somewhat worsened prompting him to present to the ED for further evaluation.   He has no known history of CAD/CHF. 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 Prior to this admission he has not  had any cardiac work-up. No ischemic evaluations in our system. He denies tobacco and illicit drug use and drinks alcohol sparingly. No significant family history of CAD.   At the time of my evaluation patient is chest pain free. He  describes a burning sensation in his lower chest, centrally located and non-radiating. He denies having chest pain like this in the past. Was not really active at onset of chest pain. He is fairly active, walking regularly and denies any exertional chest pain or SOB. He denies recent orthopnea, LE edema, or PND. He denies palpitations, dizziness, lightheadedness, or syncope.     ED course: Hypertensive, otherwise VSS. Labs notable for K 3.7, Cr 1.44>1.24, Hgb 11.2, PLT 158, A1C 6.3, LDL 47, HsTrop 12>44>65>61. EKG showed rate 83 bpm, early repol abnormalities, PVC, no STE/D, no significant change from previous. CXR is without acute findings. He was admitted to medicine. Home amlodipine was increased to 10mg  daily given elevated blood pressures. Echocardiogram showed EF 60-65%, G1DD, no RWMA, mild concentric LVH, and mild AI/mild-moderate AS. Cardiology asked to evaluate.     Past Medical History:  Diagnosis Date  . Arthritis   . Cancer ([place]) 2007   throat  . Diabetes mellitus without complication ([place])   . GERD (gastroesophageal reflux disease)   . Hyperlipidemia   . Peptic ulcer disease     Past Surgical History:  Procedure Laterality Date  . ABDOMINAL SURGERY    . HERNIA REPAIR    . PEG TUBE PLACEMENT    . PEG TUBE REMOVAL    . REPAIR OF PERFORATED ULCER       Home Medications:  Prior to Admission medications   Medication Sig Start Date End Date Taking? Authorizing Provider  acetaminophen (TYLENOL) 500 MG tablet Take 500 mg by mouth every 6 (six) hours as needed for mild pain.   Yes [provider]  guaiFENesin (ROBITUSSIN) 100 MG/5ML SOLN Take 5 mLs by mouth every 4 (four) hours as needed for cough or to loosen phlegm.   Yes [provider]  simvastatin (ZOCOR) 80 MG tablet Take 80 mg by mouth at bedtime.   Yes [provider]  terazosin (HYTRIN) 2 MG capsule Take 2 mg by mouth at bedtime.   Yes [provider]    Inpatient  Medications: Scheduled Meds: . [START ON 11/12/2020] amLODipine  10 mg Oral Daily  . aspirin EC  81 mg Oral Daily  . atorvastatin  40 mg Oral Daily  . carvedilol  3.125 mg Oral BID WC  . enoxaparin (LOVENOX) injection  40 mg Subcutaneous Q24H  . pantoprazole  40 mg Oral Daily  . terazosin  2 mg Oral QHS   Continuous Infusions:  PRN Meds: acetaminophen, benzonatate, guaiFENesin, hydrALAZINE, metoprolol tartrate, ondansetron (ZOFRAN) IV  Allergies:   No Known Allergies  Social History:   Social History   Socioeconomic History  . Marital status: Married    Spouse name: Not on file  . Number of children: Not on file  . Years of education: Not on file  . Highest education level: Not on file  Occupational History  . Not on file  Tobacco Use  . Smoking status: Never Smoker  Substance and Sexual Activity  . Alcohol use: No    Comment: former drinker  . Drug use: No  . Sexual activity: Not on file  Other Topics Concern  . Not on file  Social History Narrative  . Not on file   Social Determinants of Health  Financial Resource Strain:   . Difficulty of Paying Living Expenses: Not on file  Food Insecurity:   . Worried About Charity fundraiser in the Last Year: Not on file  . Ran Out of Food in the Last Year: Not on file  Transportation Needs:   . Lack of Transportation (Medical): Not on file  . Lack of Transportation (Non-Medical): Not on file  Physical Activity:   . Days of Exercise per Week: Not on file  . Minutes of Exercise per Session: Not on file  Stress:   . Feeling of Stress : Not on file  Social Connections:   . Frequency of Communication with Friends and Family: Not on file  . Frequency of Social Gatherings with Friends and Family: Not on file  . Attends Religious Services: Not on file  . Active Member of Clubs or Organizations: Not on file  . Attends Archivist Meetings: Not on file  . Marital Status: Not on file  Intimate Partner Violence:    . Fear of Current or Ex-Partner: Not on file  . Emotionally Abused: Not on file  . Physically Abused: Not on file  . Sexually Abused: Not on file    Family History:    Family History  Problem Relation Age of Onset  . Leukemia Sister      ROS:  Please see the history of present illness.   All other ROS reviewed and negative.     Physical Exam/Data:   Vitals:   11/11/20 1604 11/11/20 1621 11/11/20 1641 11/11/20 1709  BP: (!) 207/95 (!) 215/90 (!) 194/91 (!) 175/102  Pulse: 93     Resp: 18     Temp: 98.6 F (37 C)     TempSrc: Oral     SpO2: 96%     Weight:      Height:        Intake/Output Summary (Last 24 hours) at 11/11/2020 1726 Last data filed at 11/10/2020 2316 Gross per 24 hour  Intake 1000 ml  Output --  Net 1000 ml   Last 3 Weights 11/11/2020 11/10/2020 05/20/2017  Weight (lbs) 157 lb 157 lb 156 lb  Weight (kg) 71.215 kg 71.215 kg 70.761 kg     Body mass index is 25.34 kg/m.  General:  Well nourished, well developed, in no acute distress HEENT: sclera anicteric Lymph: no adenopathy Neck: no JVD Endocrine:  No thryomegaly Vascular: + right carotid bruits; distal pulses 2+ bilaterally  Cardiac:  normal S1, S2; RRR; +murmur, no rubs, or gallops Lungs:  clear to auscultation bilaterally, no wheezing, rhonchi or rales  Abd: soft, nontender, no hepatomegaly  Ext: no edema Musculoskeletal:  No deformities, BUE and BLE strength normal and equal Skin: warm and dry  Neuro:  CNs 2-12 intact, no focal abnormalities noted Psych:  Normal affect   EKG:  The EKG was personally reviewed and demonstrates: rate 83 bpm, early repol abnormalities, PVC, no STE/D, no significant change from previous Telemetry:  Telemetry was personally reviewed and demonstrates:  Sinus rhythm with occasional PVC  Relevant CV Studies: Echocardiogram 11/11/20: 1. Left ventricular ejection fraction, by estimation, is 60 to 65%. Left  ventricular ejection fraction by PLAX is 62 %. The  left ventricle has  normal function. The left ventricle has no regional wall motion  abnormalities. There is mild concentric left  ventricular hypertrophy. Left ventricular diastolic parameters are  consistent with Grade I diastolic dysfunction (impaired relaxation).  2. Right ventricular systolic function is normal. The  right ventricular  size is normal.  3. The mitral valve is grossly normal. No evidence of mitral valve  regurgitation.  4. The aortic valve is tricuspid. There is moderate calcification of the  aortic valve. Aortic valve regurgitation is mild. Mild to moderate aortic  valve stenosis. Aortic valve mean gradient measures 21.5 mmHg.  5. The inferior vena cava is normal in size with <50% respiratory  variability, suggesting right atrial pressure of 8 mmHg.   Laboratory Data:  High Sensitivity Troponin:   Recent Labs  Lab 11/10/20 1348 11/10/20 1548 11/10/20 1948 11/10/20 2137  TROPONINIHS 12 44* 65* 61*     Chemistry Recent Labs  Lab 11/10/20 1348 11/11/20 0250  NA 135 137  K 4.1 3.7  CL 102 105  CO2 21* 24  GLUCOSE 116* 128*  BUN 25* 15  CREATININE 1.44* 1.24  CALCIUM 8.9 8.6*  GFRNONAA 48* 57*  ANIONGAP 12 8    Recent Labs  Lab 11/10/20 1348  PROT 6.6  ALBUMIN 3.7  AST 26  ALT 17  ALKPHOS 76  BILITOT 0.5   Hematology Recent Labs  Lab 11/10/20 1348 11/11/20 0250  WBC 6.1 4.7  RBC 4.16* 3.88*  HGB 12.0* 11.2*  HCT 37.7* 35.1*  MCV 90.6 90.5  MCH 28.8 28.9  MCHC 31.8 31.9  RDW 13.7 13.6  PLT 160 158   BNPNo results for input(s): BNP, PROBNP in the last 168 hours.  DDimer No results for input(s): DDIMER in the last 168 hours.   Radiology/Studies:  DG Chest 2 View  Result Date: 11/10/2020 CLINICAL DATA:  Episode of chest pain EXAM: CHEST - 2 VIEW COMPARISON:  2017 FINDINGS: Likely chronic interstitial prominence. Eventration the right hemidiaphragm. No pleural effusion or pneumothorax. Normal heart size. No acute osseous  abnormality. IMPRESSION: No acute process in the chest. Electronically Signed   By: Macy Mis M.D.   On: 11/10/2020 14:44   ECHOCARDIOGRAM COMPLETE  Result Date: 11/11/2020    ECHOCARDIOGRAM REPORT   Patient Name:   KATHERINE SYME Date of Exam: 11/11/2020 Medical Rec #:  [id number]            Height:       66.0 in Accession #:    [id number]           Weight:       157.0 lb Date of Birth:  1934/04/22            BSA:          1.804 m Patient Age:    86 years             BP:           151/79 mmHg Patient Gender: M                    HR:           72 bpm. Exam Location:  Inpatient Procedure: 2D Echo, Cardiac Doppler and Color Doppler Indications:    Chest pain  History:        Patient has no prior history of Echocardiogram examinations.                 Signs/Symptoms:Chest Pain; Risk Factors:Hypertension, Diabetes                 and Dyslipidemia.  Sonographer:    Dustin Flock Referring Phys: [id number] [place]  1. Left ventricular ejection fraction, by estimation, is 60 to 65%. Left ventricular  ejection fraction by PLAX is 62 %. The left ventricle has normal function. The left ventricle has no regional wall motion abnormalities. There is mild concentric left ventricular hypertrophy. Left ventricular diastolic parameters are consistent with Grade I diastolic dysfunction (impaired relaxation).  2. Right ventricular systolic function is normal. The right ventricular size is normal.  3. The mitral valve is grossly normal. No evidence of mitral valve regurgitation.  4. The aortic valve is tricuspid. There is moderate calcification of the aortic valve. Aortic valve regurgitation is mild. Mild to moderate aortic valve stenosis. Aortic valve mean gradient measures 21.5 mmHg.  5. The inferior vena cava is normal in size with <50% respiratory variability, suggesting right atrial pressure of 8 mmHg. Comparison(s): No prior Echocardiogram. FINDINGS  Left Ventricle: LWMI 118 g/m2; RWT 0.62.  Calcified papillary muscle noted. Left ventricular ejection fraction, by estimation, is 60 to 65%. Left ventricular ejection fraction by PLAX is 62 %. The left ventricle has normal function. The left ventricle has no regional wall motion abnormalities. The left ventricular internal cavity size was normal in size. There is mild concentric left ventricular hypertrophy. Left ventricular diastolic parameters are consistent with Grade I diastolic dysfunction (impaired relaxation). Right Ventricle: The right ventricular size is normal. No increase in right ventricular wall thickness. Right ventricular systolic function is normal. Left Atrium: Left atrial size was normal in size. Right Atrium: Right atrial size was normal in size. Pericardium: There is no evidence of pericardial effusion. Mitral Valve: The mitral valve is grossly normal. There is mild thickening of the mitral valve leaflet(s). No evidence of mitral valve regurgitation. Tricuspid Valve: The tricuspid valve is grossly normal. Tricuspid valve regurgitation is not demonstrated. Aortic Valve: SVi 38 mL/m2. The aortic valve is tricuspid. There is moderate calcification of the aortic valve. Aortic valve regurgitation is mild. Mild to moderate aortic stenosis is present. Aortic valve mean gradient measures 21.5 mmHg. Aortic valve peak gradient measures 35.6 mmHg. Aortic valve area, by VTI measures 0.97 cm. Pulmonic Valve: The pulmonic valve was grossly normal. Pulmonic valve regurgitation is trivial. Aorta: The aortic root is normal in size and structure. Venous: The pulmonary veins were not well visualized. The inferior vena cava is normal in size with less than 50% respiratory variability, suggesting right atrial pressure of 8 mmHg. IAS/Shunts: The atrial septum is grossly normal.  LEFT VENTRICLE PLAX 2D LV EF:         Left            Diastology                ventricular     LV e' medial:    4.13 cm/s                ejection        LV E/e' medial:  16.0                 fraction by     LV e' lateral:   6.53 cm/s                PLAX is 62      LV E/e' lateral: 10.1                %. LVIDd:         4.20 cm LVIDs:         2.80 cm LV PW:         1.30 cm LV IVS:  1.30 cm LVOT diam:     2.10 cm LV SV:         65 LV SV Index:   36 LVOT Area:     3.46 cm  RIGHT VENTRICLE RV Basal diam:  2.90 cm RV S prime:     14.90 cm/s TAPSE (M-mode): 2.8 cm LEFT ATRIUM             Index       RIGHT ATRIUM           Index LA diam:        3.50 cm 1.94 cm/m  RA Area:     17.90 cm LA Vol (A2C):   48.1 ml 26.66 ml/m RA Volume:   46.20 ml  25.61 ml/m LA Vol (A4C):   43.3 ml 24.00 ml/m LA Biplane Vol: 46.3 ml 25.66 ml/m  AORTIC VALVE AV Area (Vmax):    1.02 cm AV Area (Vmean):   0.84 cm AV Area (VTI):     0.97 cm AV Vmax:           298.50 cm/s AV Vmean:          221.000 cm/s AV VTI:            0.674 m AV Peak Grad:      35.6 mmHg AV Mean Grad:      21.5 mmHg LVOT Vmax:         88.30 cm/s LVOT Vmean:        53.700 cm/s LVOT VTI:          0.188 m LVOT/AV VTI ratio: 0.28  AORTA Ao Root diam: 3.20 cm MITRAL VALVE MV Area (PHT): 3.21 cm     SHUNTS MV Decel Time: 236 msec     Systemic VTI:  0.19 m MV E velocity: 66.20 cm/s   Systemic Diam: 2.10 cm MV A velocity: 121.00 cm/s MV E/A ratio:  0.55 Rudean Haskell MD Electronically signed by Rudean Haskell MD Signature Date/Time: 11/11/2020/1:11:06 PM    Final      Assessment and Plan:   1. Chest pain in patient without CAD history: patient presented with chest pain which occurred with minimal activity. Primarily a subxiphoid burning discomfort. Onset occurred shortly after eating breakfast. EKG showed no ischemic changes. HsTrop peaked at 6 and trended down - trend not c/w ACS. Echo showed EF 60-65%, G1DD, and no RWMA. BP was significantly elevated on admission - suspect elevated troponin was 2/2 demand ischemia. Risk factors for CAD include HTN, HLD, DM, and possible his history of XRT puts him at higher risk. HLD and DM type  2 are well controlled with LDL 47 and A1C 6.3 respectively. He does have a history of PUD/GERD - possible this has contributed; cannot exclude esophageal spasms either.  - Will check a RUQ [place] to r/o gallbladder pathology that may explain subxiphoid pain - Would plan for a NST to r/o ischemic etiology  2. HTN: BP markedly elevated this admission. He was started on amlodipine which was subsequently increased to 10mg  daily. He has been receiving prn IV hydralazine and IV metoprolol as well. Cr is mildly elevated so will hold off on initiating ARB.  - Favor adding carvedilol - can uptitrate as needed - Continue amlodipine 10mg  daily - Continue prn IV hydralazine  3. Aortic valvulopathy: noted to have mild AI and mild-moderate AS on echocardiogram.  - Continue routine outpatient monitoring going forward.   4. HLD: LDL well controled at 47 this admission -  Continue statin  5. DM type 2: Well controlled with A1C 6.3 this admission. Appears to be diet controlled - Continue management per primary team.  6. PUD with history of perforated ulcer: patient presents with burning chest pain. Etiology remains unclear. He was started on pantoprazole this admission for possible GI component.  - Continue pantoprazole  7. AKI likely on CKD stage 3: Cr 1.44 on admission, improved to 1.24 with IVFs which I suspect is his baseline.  - Continue close monitoring  8. Carotid bruit: noted to have right carotid bruit on exam. No symptoms to suggest high grade obstruction. - Can check carotid dopplers outpatient    For questions or updates, please contact [place] Please consult [email or web address] for contact info under    Signed, Abigail Butts, PA-C  11/11/2020 5:26 PM

## 2020-11-11 NOTE — ED Notes (Signed)
Up walking to bathroom, gait steady

## 2020-11-11 NOTE — ED Notes (Signed)
Ordered breakfast 

## 2020-11-11 NOTE — Progress Notes (Signed)
  Echocardiogram 2D Echocardiogram has been performed.  Raymond Grant 11/11/2020, 10:27 AM

## 2020-11-11 NOTE — Plan of Care (Signed)
  Problem: Safety: Goal: Ability to remain free from injury will improve Outcome: Progressing   

## 2020-11-11 NOTE — ED Notes (Signed)
Pt given a urinal.

## 2020-11-11 NOTE — Progress Notes (Signed)
Progress Note    Raymond Grant  BJY:[id number] DOB: 06-10-1934  DOA: 11/10/2020 PCP: Leanna Battles, MD    Brief Narrative:    Medical records reviewed and are as summarized below:  Raymond Grant is an 86 y.o. male with medical history significant for history of laryngeal cancer s/p XRT/chemotherapy, diet-controlled type 2 diabetes, hypertension, hyperlipidemia, peptic ulcer disease with history of perforated ulcer requiring laparotomy who presents to the ED for evaluation of chest pain.   Assessment/Plan:   Principal Problem:   Chest pain Active Problems:   Peptic ulcer disease   Diabetes mellitus without complication ([place])   Hyperlipidemia associated with type 2 diabetes mellitus ([place])   History of laryngeal cancer   Hypertension associated with diabetes ([place])   Chest pain: Patient with central lower sternal burning type chest pressure occurring with minimal activity.  Has mixed anginal symptoms or GI type symptoms.  EKG without acute changes.  He has had a rise in troponin from 12 > 60   -He is currently chest pain-free but has episode overnight.   -no prior cardiac work up -cardiology consult -tele -ASA -echo: Left ventricular ejection fraction, by estimation, is 60 to 65%. Left  ventricular ejection fraction by PLAX is 62 %. The left ventricle has  normal function. The left ventricle has no regional wall motion  abnormalities. There is mild concentric left  ventricular hypertrophy. Left ventricular diastolic parameters are  consistent with Grade I diastolic dysfunction (impaired relaxation). -per son can usually walk 1 mile w/o issues  Hypertension: Hypertensive on arrival.  Not on antihypertensives as an outpatient other than terazosin for BPH. -Start amlodipine 5 mg daily- increase to 10 mg -Continue terazosin -add IV hydralazine if needed  Elevated creatinine: -improved with IVF  Type 2 diabetes: A1c: 6.3  Peptic ulcer disease/GERD:   Ulcer was 50+ years ago Patient reports a history of perforated bleeding ulcer in the past requiring laparotomy for surgical repair.  -takes Mylanta PRN -no recent EGD in our system -Start oral Protonix 40 mg daily  History of laryngeal cancer s/p XRT/chemotherapy in 2007: Following with ENT, Dr. Carol Ada.  Stable and managed with observation now.  BPH: Continue Terazosin.   Family Communication/Anticipated D/C date and plan/Code Status   DVT prophylaxis: Lovenox ordered. Code Status: Full Code.  Family Communication: son at bedside Disposition Plan: Status is: Observation  The patient remains OBS appropriate and will d/c before 2 midnights.  Dispo: The patient is from: Home              Anticipated d/c is to: Home              Anticipated d/c date is: 1 day              Patient currently is not medically stable to d/c.- cardiology eval         Medical Consultants:    cards  Subjective:   Upset I did not see him before 11 this AM  Objective:    Vitals:   11/11/20 1245 11/11/20 1300 11/11/20 1331 11/11/20 1345  BP: (!) 147/77 (!) 159/90 (!) 158/74 123/69  Pulse: 94 92 86 88  Resp: 17 18 13 15   Temp:      TempSrc:      SpO2: 94% 91% 91% 95%  Weight:      Height:        Intake/Output Summary (Last 24 hours) at 11/11/2020 1452 Last data filed at 11/10/2020 2316 Gross per  24 hour  Intake 1000 ml  Output --  Net 1000 ml   Filed Weights   11/10/20 1323 11/11/20 0750  Weight: 71.2 kg 71.2 kg    Exam:  General: Appearance:      well developed male in no acute distress     Lungs:     respirations unlabored  Heart:    Normal heart rate. Normal rhythm.   MS:   All extremities are intact.   Neurologic:   Awake, alert, oriented x 3. No apparent focal neurological           defect.     Data Reviewed:   I have personally reviewed following labs and imaging studies:  Labs: Labs show the following:   Basic Metabolic Panel: Recent Labs   Lab 11/10/20 1348 11/11/20 0250  NA 135 137  K 4.1 3.7  CL 102 105  CO2 21* 24  GLUCOSE 116* 128*  BUN 25* 15  CREATININE 1.44* 1.24  CALCIUM 8.9 8.6*   GFR Estimated Creatinine Clearance: 40 mL/min (by C-G formula based on SCr of 1.24 mg/dL). Liver Function Tests: Recent Labs  Lab 11/10/20 1348  AST 26  ALT 17  ALKPHOS 76  BILITOT 0.5  PROT 6.6  ALBUMIN 3.7   Recent Labs  Lab 11/10/20 1348  LIPASE 19   No results for input(s): AMMONIA in the last 168 hours. Coagulation profile No results for input(s): INR, PROTIME in the last 168 hours.  CBC: Recent Labs  Lab 11/10/20 1348 11/11/20 0250  WBC 6.1 4.7  HGB 12.0* 11.2*  HCT 37.7* 35.1*  MCV 90.6 90.5  PLT 160 158   Cardiac Enzymes: No results for input(s): CKTOTAL, CKMB, CKMBINDEX, TROPONINI in the last 168 hours. BNP (last 3 results) No results for input(s): PROBNP in the last 8760 hours. CBG: No results for input(s): GLUCAP in the last 168 hours. D-Dimer: No results for input(s): DDIMER in the last 72 hours. Hgb A1c: Recent Labs    11/11/20 0250  HGBA1C 6.3*   Lipid Profile: Recent Labs    11/11/20 0250  CHOL 143  HDL 70  LDLCALC 47  TRIG 130  CHOLHDL 2.0   Thyroid function studies: No results for input(s): TSH, T4TOTAL, T3FREE, THYROIDAB in the last 72 hours.  Invalid input(s): FREET3 Anemia work up: No results for input(s): VITAMINB12, FOLATE, FERRITIN, TIBC, IRON, RETICCTPCT in the last 72 hours. Sepsis Labs: Recent Labs  Lab 11/10/20 1348 11/11/20 0250  WBC 6.1 4.7    Microbiology Recent Results (from the past 240 hour(s))  Respiratory Panel by RT PCR (Flu A&B, Covid) - Nasopharyngeal Swab     Status: None   Collection Time: 11/10/20  9:08 PM   Specimen: Nasopharyngeal Swab  Result Value Ref Range Status   SARS Coronavirus 2 by RT PCR NEGATIVE NEGATIVE Final    Comment: (NOTE) SARS-CoV-2 target nucleic acids are NOT DETECTED.  The SARS-CoV-2 RNA is generally detectable  in upper respiratoy specimens during the acute phase of infection. The lowest concentration of SARS-CoV-2 viral copies this assay can detect is 131 copies/mL. A negative result does not preclude SARS-Cov-2 infection and should not be used as the sole basis for treatment or other patient management decisions. A negative result may occur with  improper specimen collection/handling, submission of specimen other than nasopharyngeal swab, presence of viral mutation(s) within the areas targeted by this assay, and inadequate number of viral copies (<131 copies/mL). A negative result must be combined with clinical observations, patient  history, and epidemiological information. The expected result is Negative.  Fact Sheet for Patients:  PinkCheek.be  Fact Sheet for Healthcare Providers:  GravelBags.it  This test is no t yet approved or cleared by the [place] FDA and  has been authorized for detection and/or diagnosis of SARS-CoV-2 by FDA under an Emergency Use Authorization (EUA). This EUA will remain  in effect (meaning this test can be used) for the duration of the COVID-19 declaration under Section 564(b)(1) of the Act, 21 U.S.C. section 360bbb-3(b)(1), unless the authorization is terminated or revoked sooner.     Influenza A by PCR NEGATIVE NEGATIVE Final   Influenza B by PCR NEGATIVE NEGATIVE Final    Comment: (NOTE) The Xpert Xpress SARS-CoV-2/FLU/RSV assay is intended as an aid in  the diagnosis of influenza from Nasopharyngeal swab specimens and  should not be used as a sole basis for treatment. Nasal washings and  aspirates are unacceptable for Xpert Xpress SARS-CoV-2/FLU/RSV  testing.  Fact Sheet for Patients: PinkCheek.be  Fact Sheet for Healthcare Providers: GravelBags.it  This test is not yet approved or cleared by the [place] FDA and  has been  authorized for detection and/or diagnosis of SARS-CoV-2 by  FDA under an Emergency Use Authorization (EUA). This EUA will remain  in effect (meaning this test can be used) for the duration of the  Covid-19 declaration under Section 564(b)(1) of the Act, 21  U.S.C. section 360bbb-3(b)(1), unless the authorization is  terminated or revoked. Performed at [facility], [place] [address], [place], [place] [address]     Procedures and diagnostic studies:  DG Chest 2 View  Result Date: 11/10/2020 CLINICAL DATA:  Episode of chest pain EXAM: CHEST - 2 VIEW COMPARISON:  2017 FINDINGS: Likely chronic interstitial prominence. Eventration the right hemidiaphragm. No pleural effusion or pneumothorax. Normal heart size. No acute osseous abnormality. IMPRESSION: No acute process in the chest. Electronically Signed   By: Macy Mis M.D.   On: 11/10/2020 14:44   ECHOCARDIOGRAM COMPLETE  Result Date: 11/11/2020    ECHOCARDIOGRAM REPORT   Patient Name:   Raymond Grant Date of Exam: 11/11/2020 Medical Rec #:  [id number]            Height:       66.0 in Accession #:    [id number]           Weight:       157.0 lb Date of Birth:  [birth year]            BSA:          1.804 m Patient Age:    34 years             BP:           151/79 mmHg Patient Gender: M                    HR:           72 bpm. Exam Location:  Inpatient Procedure: 2D Echo, Cardiac Doppler and Color Doppler Indications:    Chest pain  History:        Patient has no prior history of Echocardiogram examinations.                 Signs/Symptoms:Chest Pain; Risk Factors:Hypertension, Diabetes                 and Dyslipidemia.  Sonographer:    Dustin Flock Referring Phys: [id number] [place]  1. Left ventricular ejection fraction, by estimation, is 60 to 65%. Left ventricular ejection fraction by PLAX is 62 %. The left ventricle has normal function. The left ventricle has no regional wall motion abnormalities. There is mild  concentric left ventricular hypertrophy. Left ventricular diastolic parameters are consistent with Grade I diastolic dysfunction (impaired relaxation).  2. Right ventricular systolic function is normal. The right ventricular size is normal.  3. The mitral valve is grossly normal. No evidence of mitral valve regurgitation.  4. The aortic valve is tricuspid. There is moderate calcification of the aortic valve. Aortic valve regurgitation is mild. Mild to moderate aortic valve stenosis. Aortic valve mean gradient measures 21.5 mmHg.  5. The inferior vena cava is normal in size with <50% respiratory variability, suggesting right atrial pressure of 8 mmHg. Comparison(s): No prior Echocardiogram. FINDINGS  Left Ventricle: LWMI 118 g/m2; RWT 0.62. Calcified papillary muscle noted. Left ventricular ejection fraction, by estimation, is 60 to 65%. Left ventricular ejection fraction by PLAX is 62 %. The left ventricle has normal function. The left ventricle has no regional wall motion abnormalities. The left ventricular internal cavity size was normal in size. There is mild concentric left ventricular hypertrophy. Left ventricular diastolic parameters are consistent with Grade I diastolic dysfunction (impaired relaxation). Right Ventricle: The right ventricular size is normal. No increase in right ventricular wall thickness. Right ventricular systolic function is normal. Left Atrium: Left atrial size was normal in size. Right Atrium: Right atrial size was normal in size. Pericardium: There is no evidence of pericardial effusion. Mitral Valve: The mitral valve is grossly normal. There is mild thickening of the mitral valve leaflet(s). No evidence of mitral valve regurgitation. Tricuspid Valve: The tricuspid valve is grossly normal. Tricuspid valve regurgitation is not demonstrated. Aortic Valve: SVi 38 mL/m2. The aortic valve is tricuspid. There is moderate calcification of the aortic valve. Aortic valve regurgitation is mild.  Mild to moderate aortic stenosis is present. Aortic valve mean gradient measures 21.5 mmHg. Aortic valve peak gradient measures 35.6 mmHg. Aortic valve area, by VTI measures 0.97 cm. Pulmonic Valve: The pulmonic valve was grossly normal. Pulmonic valve regurgitation is trivial. Aorta: The aortic root is normal in size and structure. Venous: The pulmonary veins were not well visualized. The inferior vena cava is normal in size with less than 50% respiratory variability, suggesting right atrial pressure of 8 mmHg. IAS/Shunts: The atrial septum is grossly normal.  LEFT VENTRICLE PLAX 2D LV EF:         Left            Diastology                ventricular     LV e' medial:    4.13 cm/s                ejection        LV E/e' medial:  16.0                fraction by     LV e' lateral:   6.53 cm/s                PLAX is 62      LV E/e' lateral: 10.1                %. LVIDd:         4.20 cm LVIDs:         2.80 cm LV PW:  1.30 cm LV IVS:        1.30 cm LVOT diam:     2.10 cm LV SV:         65 LV SV Index:   36 LVOT Area:     3.46 cm  RIGHT VENTRICLE RV Basal diam:  2.90 cm RV S prime:     14.90 cm/s TAPSE (M-mode): 2.8 cm LEFT ATRIUM             Index       RIGHT ATRIUM           Index LA diam:        3.50 cm 1.94 cm/m  RA Area:     17.90 cm LA Vol (A2C):   48.1 ml 26.66 ml/m RA Volume:   46.20 ml  25.61 ml/m LA Vol (A4C):   43.3 ml 24.00 ml/m LA Biplane Vol: 46.3 ml 25.66 ml/m  AORTIC VALVE AV Area (Vmax):    1.02 cm AV Area (Vmean):   0.84 cm AV Area (VTI):     0.97 cm AV Vmax:           298.50 cm/s AV Vmean:          221.000 cm/s AV VTI:            0.674 m AV Peak Grad:      35.6 mmHg AV Mean Grad:      21.5 mmHg LVOT Vmax:         88.30 cm/s LVOT Vmean:        53.700 cm/s LVOT VTI:          0.188 m LVOT/AV VTI ratio: 0.28  AORTA Ao Root diam: 3.20 cm MITRAL VALVE MV Area (PHT): 3.21 cm     SHUNTS MV Decel Time: 236 msec     Systemic VTI:  0.19 m MV E velocity: 66.20 cm/s   Systemic Diam: 2.10 cm MV A  velocity: 121.00 cm/s MV E/A ratio:  0.55 Rudean Haskell MD Electronically signed by Rudean Haskell MD Signature Date/Time: 11/11/2020/1:11:06 PM    Final     Medications:   . [START ON 11/12/2020] amLODipine  10 mg Oral Daily  . aspirin EC  81 mg Oral Daily  . atorvastatin  40 mg Oral Daily  . betamethasone acetate-betamethasone sodium phosphate  3 mg Intramuscular Once  . betamethasone acetate-betamethasone sodium phosphate  3 mg Intramuscular Once  . enoxaparin (LOVENOX) injection  40 mg Subcutaneous Q24H  . pantoprazole  40 mg Oral Daily  . terazosin  2 mg Oral QHS   Continuous Infusions:   LOS: 0 days   Geradine Girt  [facility]   How to contact the [facility] Attending or Consulting provider [place] or covering provider during after hours [place], for this patient?  1. Check the care team in [facility] and look for a) attending/consulting [facility] provider listed and b) the [facility] team listed 2. Log into [email or web address] and use [facility]'s universal password to access. If you do not have the password, please contact the hospital operator. 3. Locate the [facility] provider you are looking for under [facility] and page to a number that you can be directly reached. 4. If you still have difficulty reaching the provider, please page the [facility] (Director on Call) for the Hospitalists listed on amion for assistance.  11/11/2020, 2:52 PM

## 2020-11-11 NOTE — ED Notes (Signed)
Lunch Tray Orders @ 0037.

## 2020-11-11 NOTE — ED Notes (Signed)
EKG lead came off. Replaced lead on patient.

## 2020-11-12 ENCOUNTER — Observation Stay (HOSPITAL_BASED_OUTPATIENT_CLINIC_OR_DEPARTMENT_OTHER): Payer: Medicare Other

## 2020-11-12 DIAGNOSIS — E1169 Type 2 diabetes mellitus with other specified complication: Secondary | ICD-10-CM | POA: Diagnosis not present

## 2020-11-12 DIAGNOSIS — Z8521 Personal history of malignant neoplasm of larynx: Secondary | ICD-10-CM | POA: Diagnosis not present

## 2020-11-12 DIAGNOSIS — E785 Hyperlipidemia, unspecified: Secondary | ICD-10-CM | POA: Diagnosis not present

## 2020-11-12 DIAGNOSIS — K279 Peptic ulcer, site unspecified, unspecified as acute or chronic, without hemorrhage or perforation: Secondary | ICD-10-CM

## 2020-11-12 DIAGNOSIS — I152 Hypertension secondary to endocrine disorders: Secondary | ICD-10-CM | POA: Diagnosis not present

## 2020-11-12 DIAGNOSIS — E119 Type 2 diabetes mellitus without complications: Secondary | ICD-10-CM | POA: Diagnosis not present

## 2020-11-12 DIAGNOSIS — E1159 Type 2 diabetes mellitus with other circulatory complications: Secondary | ICD-10-CM | POA: Diagnosis not present

## 2020-11-12 DIAGNOSIS — R079 Chest pain, unspecified: Secondary | ICD-10-CM | POA: Diagnosis not present

## 2020-11-12 LAB — NM MYOCAR MULTI W/SPECT W/WALL MOTION / EF
MPHR: 136 {beats}/min
Peak HR: 90 {beats}/min
Percent HR: 66 %
Rest HR: 76 {beats}/min

## 2020-11-12 LAB — HEPATIC FUNCTION PANEL
ALT: 13 U/L (ref 0–44)
AST: 22 U/L (ref 15–41)
Albumin: 3.4 g/dL — ABNORMAL LOW (ref 3.5–5.0)
Alkaline Phosphatase: 72 U/L (ref 38–126)
Bilirubin, Direct: 0.1 mg/dL (ref 0.0–0.2)
Indirect Bilirubin: 0.8 mg/dL (ref 0.3–0.9)
Total Bilirubin: 0.9 mg/dL (ref 0.3–1.2)
Total Protein: 6.2 g/dL — ABNORMAL LOW (ref 6.5–8.1)

## 2020-11-12 LAB — AMYLASE: Amylase: 29 U/L (ref 28–100)

## 2020-11-12 MED ORDER — REGADENOSON 0.4 MG/5ML IV SOLN
0.4000 mg | Freq: Once | INTRAVENOUS | Status: AC
  Administered 2020-11-12: 0.4 mg via INTRAVENOUS
  Filled 2020-11-12: qty 5

## 2020-11-12 MED ORDER — GUAIFENESIN 100 MG/5ML PO SOLN: 5.0000 mL | ORAL | Status: DC | PRN

## 2020-11-12 MED ORDER — NITROGLYCERIN 0.4 MG SL SUBL: 0.4000 mg | SUBLINGUAL_TABLET | SUBLINGUAL | 0 refills | Status: AC | PRN

## 2020-11-12 MED ORDER — TECHNETIUM TC 99M TETROFOSMIN IV KIT
10.0000 | PACK | Freq: Once | INTRAVENOUS | Status: AC | PRN
  Administered 2020-11-12: 10 via INTRAVENOUS

## 2020-11-12 MED ORDER — PANTOPRAZOLE SODIUM 40 MG PO TBEC: 40.0000 mg | DELAYED_RELEASE_TABLET | Freq: Every day | ORAL | 0 refills | Status: AC

## 2020-11-12 MED ORDER — REGADENOSON 0.4 MG/5ML IV SOLN
INTRAVENOUS | Status: AC
  Filled 2020-11-12: qty 5

## 2020-11-12 NOTE — Progress Notes (Signed)
Up walking in room, BP assessed, encouraged to rest in bed.  Awaiting stress test.

## 2020-11-12 NOTE — Discharge Instructions (Signed)
Low risk myocardial perfusion imaging (stress test).  Risk modification and use of sublingual nitroglycerin if recurrent chest discomfort.  If recurring symptoms, coronary angiography should be considered.

## 2020-11-12 NOTE — Plan of Care (Signed)
°  Problem: Safety: °Goal: Ability to remain free from injury will improve °Outcome: Progressing °  °Problem: Activity: °Goal: Risk for activity intolerance will decrease °Outcome: Progressing °  °

## 2020-11-12 NOTE — Progress Notes (Signed)
Progress Note  Patient Name: Raymond Grant Date of Encounter: 11/12/2020  [facility] Cardiologist: No primary care provider on file. Linard Millers  Subjective   The patient feels well.  Chest discomfort and right upper quadrant discomfort have resolved.  He has been in nuclear medicine most of the day.  He has not yet eaten.  He has had significant fluctuations in blood pressure.  He was not on any antihypertensive therapy prior to admission.  Low-dose carvedilol was added here and may be the reason for dramatic swings at times with systolic pressures less than 100 mmHg.  Inpatient Medications    Scheduled Meds: . aspirin EC  81 mg Oral Daily  . atorvastatin  40 mg Oral Daily  . carvedilol  3.125 mg Oral BID WC  . enoxaparin (LOVENOX) injection  40 mg Subcutaneous Q24H  . pantoprazole  40 mg Oral Daily  . regadenoson      . terazosin  2 mg Oral QHS   Continuous Infusions:  PRN Meds: acetaminophen, benzonatate, guaiFENesin, hydrALAZINE, ondansetron (ZOFRAN) IV   Vital Signs    Vitals:   11/12/20 1159 11/12/20 1201 11/12/20 1323 11/12/20 1400  BP: 139/68 131/65 (!) 92/51 (!) 98/55  Pulse:   81   Resp:   16   Temp:   98.1 F (36.7 C)   TempSrc:   Oral   SpO2:   (!) 85% 91%  Weight:      Height:        Intake/Output Summary (Last 24 hours) at 11/12/2020 1531 Last data filed at 11/11/2020 2300 Gross per 24 hour  Intake 240 ml  Output --  Net 240 ml   Last 3 Weights 11/12/2020 11/11/2020 11/10/2020  Weight (lbs) 151 lb 4.8 oz 157 lb 157 lb  Weight (kg) 68.629 kg 71.215 kg 71.215 kg      Telemetry    Sinus rhythm and sinus bradycardia- Personally Reviewed  ECG    A repeat tracing will be performed today.- Personally Reviewed  Physical Exam  Sitting in recliner.  Good skin color. GEN: No acute distress.   Neck: No JVD Cardiac: RRR, no murmurs, rubs, or gallops.  Respiratory: Clear to auscultation bilaterally. GI:  The abdomen is soft. MS: No  edema; No deformity. Neuro:  Nonfocal  Psych: Normal affect   Labs    High Sensitivity Troponin:   Recent Labs  Lab 11/10/20 1348 11/10/20 1548 11/10/20 1948 11/10/20 2137  TROPONINIHS 12 44* 65* 61*      Chemistry Recent Labs  Lab 11/10/20 1348 11/11/20 0250 11/12/20 0350  NA 135 137  --   K 4.1 3.7  --   CL 102 105  --   CO2 21* 24  --   GLUCOSE 116* 128*  --   BUN 25* 15  --   CREATININE 1.44* 1.24  --   CALCIUM 8.9 8.6*  --   PROT 6.6  --  6.2*  ALBUMIN 3.7  --  3.4*  AST 26  --  22  ALT 17  --  13  ALKPHOS 76  --  72  BILITOT 0.5  --  0.9  GFRNONAA 48* 57*  --   ANIONGAP 12 8  --      Hematology Recent Labs  Lab 11/10/20 1348 11/11/20 0250  WBC 6.1 4.7  RBC 4.16* 3.88*  HGB 12.0* 11.2*  HCT 37.7* 35.1*  MCV 90.6 90.5  MCH 28.8 28.9  MCHC 31.8 31.9  RDW 13.7 13.6  PLT  160 158    BNP Recent Labs  Lab 11/11/20 1806  BNP 189.8*     DDimer No results for input(s): DDIMER in the last 168 hours.   Radiology    NM Myocar Multi W/Spect W/Wall Motion / EF  Result Date: 11/12/2020 CLINICAL DATA:  Chest pain EXAM: MYOCARDIAL IMAGING WITH SPECT (REST AND PHARMACOLOGIC-STRESS) GATED LEFT VENTRICULAR WALL MOTION STUDY LEFT VENTRICULAR EJECTION FRACTION TECHNIQUE: Standard myocardial SPECT imaging was performed after resting intravenous injection of 10 mCi Tc-38m tetrofosmin. Subsequently, intravenous infusion of Lexiscan was performed under the supervision of the Cardiology staff. At peak effect of the drug, 30 mCi Tc-79m tetrofosmin was injected intravenously and standard myocardial SPECT imaging was performed. Quantitative gated imaging was also performed to evaluate left ventricular wall motion, and estimate left ventricular ejection fraction. COMPARISON:  None. FINDINGS: Perfusion: No significant decreased activity in the left ventricle on stress imaging to suggest reversible ischemia or infarction. Wall Motion: Grossly normal wall motion.  No LV  chamber dilatation. Left Ventricular Ejection Fraction: 59 % End diastolic volume 67 ml End systolic volume 28 ml IMPRESSION: 1. No significant reversible ischemia or infarction. 2. Grossly normal left ventricular wall motion. 3. Left ventricular ejection fraction 59% 4. Non invasive risk stratification*: Low *2012 Appropriate Use Criteria for Coronary Revascularization Focused Update: J Am Coll Cardiol. 3474;25(9):563-875. http://content.airportbarriers.com.aspx?articleid=[email or web address] Electronically Signed   By: Jerilynn Mages.  Shick M.D.   On: 11/12/2020 15:07   ECHOCARDIOGRAM COMPLETE  Result Date: 11/11/2020    ECHOCARDIOGRAM REPORT   Patient Name:   Raymond Grant Date of Exam: 11/11/2020 Medical Rec #:  [id number]            Height:       66.0 in Accession #:    [id number]           Weight:       157.0 lb Date of Birth:  1934/04/26            BSA:          1.804 m Patient Age:    86 years             BP:           151/79 mmHg Patient Gender: M                    HR:           72 bpm. Exam Location:  Inpatient Procedure: 2D Echo, Cardiac Doppler and Color Doppler Indications:    Chest pain  History:        Patient has no prior history of Echocardiogram examinations.                 Signs/Symptoms:Chest Pain; Risk Factors:Hypertension, Diabetes                 and Dyslipidemia.  Sonographer:    Dustin Flock Referring Phys: [id number] [place]  1. Left ventricular ejection fraction, by estimation, is 60 to 65%. Left ventricular ejection fraction by PLAX is 62 %. The left ventricle has normal function. The left ventricle has no regional wall motion abnormalities. There is mild concentric left ventricular hypertrophy. Left ventricular diastolic parameters are consistent with Grade I diastolic dysfunction (impaired relaxation).  2. Right ventricular systolic function is normal. The right ventricular size is normal.  3. The mitral valve is grossly normal. No evidence of mitral valve regurgitation.  4.  The aortic valve is tricuspid. There is moderate calcification of  the aortic valve. Aortic valve regurgitation is mild. Mild to moderate aortic valve stenosis. Aortic valve mean gradient measures 21.5 mmHg.  5. The inferior vena cava is normal in size with <50% respiratory variability, suggesting right atrial pressure of 8 mmHg. Comparison(s): No prior Echocardiogram. FINDINGS  Left Ventricle: LWMI 118 g/m2; RWT 0.62. Calcified papillary muscle noted. Left ventricular ejection fraction, by estimation, is 60 to 65%. Left ventricular ejection fraction by PLAX is 62 %. The left ventricle has normal function. The left ventricle has no regional wall motion abnormalities. The left ventricular internal cavity size was normal in size. There is mild concentric left ventricular hypertrophy. Left ventricular diastolic parameters are consistent with Grade I diastolic dysfunction (impaired relaxation). Right Ventricle: The right ventricular size is normal. No increase in right ventricular wall thickness. Right ventricular systolic function is normal. Left Atrium: Left atrial size was normal in size. Right Atrium: Right atrial size was normal in size. Pericardium: There is no evidence of pericardial effusion. Mitral Valve: The mitral valve is grossly normal. There is mild thickening of the mitral valve leaflet(s). No evidence of mitral valve regurgitation. Tricuspid Valve: The tricuspid valve is grossly normal. Tricuspid valve regurgitation is not demonstrated. Aortic Valve: SVi 38 mL/m2. The aortic valve is tricuspid. There is moderate calcification of the aortic valve. Aortic valve regurgitation is mild. Mild to moderate aortic stenosis is present. Aortic valve mean gradient measures 21.5 mmHg. Aortic valve peak gradient measures 35.6 mmHg. Aortic valve area, by VTI measures 0.97 cm. Pulmonic Valve: The pulmonic valve was grossly normal. Pulmonic valve regurgitation is trivial. Aorta: The aortic root is normal in size and  structure. Venous: The pulmonary veins were not well visualized. The inferior vena cava is normal in size with less than 50% respiratory variability, suggesting right atrial pressure of 8 mmHg. IAS/Shunts: The atrial septum is grossly normal.  LEFT VENTRICLE PLAX 2D LV EF:         Left            Diastology                ventricular     LV e' medial:    4.13 cm/s                ejection        LV E/e' medial:  16.0                fraction by     LV e' lateral:   6.53 cm/s                PLAX is 62      LV E/e' lateral: 10.1                %. LVIDd:         4.20 cm LVIDs:         2.80 cm LV PW:         1.30 cm LV IVS:        1.30 cm LVOT diam:     2.10 cm LV SV:         65 LV SV Index:   36 LVOT Area:     3.46 cm  RIGHT VENTRICLE RV Basal diam:  2.90 cm RV S prime:     14.90 cm/s TAPSE (M-mode): 2.8 cm LEFT ATRIUM             Index       RIGHT ATRIUM  Index LA diam:        3.50 cm 1.94 cm/m  RA Area:     17.90 cm LA Vol (A2C):   48.1 ml 26.66 ml/m RA Volume:   46.20 ml  25.61 ml/m LA Vol (A4C):   43.3 ml 24.00 ml/m LA Biplane Vol: 46.3 ml 25.66 ml/m  AORTIC VALVE AV Area (Vmax):    1.02 cm AV Area (Vmean):   0.84 cm AV Area (VTI):     0.97 cm AV Vmax:           298.50 cm/s AV Vmean:          221.000 cm/s AV VTI:            0.674 m AV Peak Grad:      35.6 mmHg AV Mean Grad:      21.5 mmHg LVOT Vmax:         88.30 cm/s LVOT Vmean:        53.700 cm/s LVOT VTI:          0.188 m LVOT/AV VTI ratio: 0.28  AORTA Ao Root diam: 3.20 cm MITRAL VALVE MV Area (PHT): 3.21 cm     SHUNTS MV Decel Time: 236 msec     Systemic VTI:  0.19 m MV E velocity: 66.20 cm/s   Systemic Diam: 2.10 cm MV A velocity: 121.00 cm/s MV E/A ratio:  0.55 Rudean Haskell MD Electronically signed by Rudean Haskell MD Signature Date/Time: 11/11/2020/1:11:06 PM    Final    US Abdomen Limited RUQ (LIVER/GB)  Result Date: 11/11/2020 CLINICAL DATA:  Right upper quadrant abdominal pain today. EXAM: ULTRASOUND ABDOMEN LIMITED  RIGHT UPPER QUADRANT COMPARISON:  None. FINDINGS: Gallbladder: The gallbladder is distended, measuring up to 10 cm in length. No evidence of gallstones, gallbladder wall thickening or sonographic Murphy sign. Common bile duct: Diameter: 6 mm Liver: No focal lesion identified. Within normal limits in parenchymal echogenicity. Portal vein is patent on color Doppler imaging with normal direction of blood flow towards the liver. Other: None. IMPRESSION: 1. Gallbladder hydrops could indicate chronic obstruction of the cystic duct. 2. No signs of acute cholecystitis, cholelithiasis or biliary dilatation. Electronically Signed   By: Richardean Sale M.D.   On: 11/11/2020 19:05    Cardiac and Other Studies    Nuclear scintigraphy completed 11/12/2020, was low risk without evidence of infarction or ischemia.  EF greater than 55%.  Above echo demonstrates no significant abnormality other than moderate aortic stenosis.  11/11/2020 Limited abdominal ultrasound: IMPRESSION: 1. Gallbladder hydrops could indicate chronic obstruction of the cystic duct. 2. No signs of acute cholecystitis, cholelithiasis or biliary dilatation.  Patient Profile     86 y.o. male  a PMH of HTN, HLD, DM type 2, PUD with hx of perforated ulcer requiring laparotomy, and laryngeal cancer s/p chemo/XRT, who is being seen today for the evaluation of chest pain and mild TI elevation  12=> 65.  Low risk myocardial perfusion imaging on 11/12/2020.  Assessment & Plan    1. Chest discomfort with minimal elevation in troponin I: Low risk myocardial perfusion imaging.  Risk modification and use of sublingual nitroglycerin if recurrent chest discomfort.  If recurring symptoms, coronary angiography should be considered.  At this point, considering the reassuring appearance of the nuclear study, medical therapy is recommended.  I would suggest high intensity statin therapy.  These findings were discussed in detail with the patient's son and the  patient himself. 2. Gallbladder Hydrops of uncertain significance.  Did  complain of right upper quadrant abdominal pain last evening. 3. Labile blood pressure recordings during this admission.  Discontinue carvedilol.  [facility] will sign off.   Medication Recommendations: None other than use of sublingual nitroglycerin if recurrent significant chest burning to gauge if it responds.  If so, we consideration of coronary disease would be in order. Other recommendations (labs, testing, etc): Determine whether or not gallbladder hydrops is of clinical significance. Follow up as an outpatient: As needed by cardiology service.  For questions or updates, please contact [facility] Please consult [email or web address] for contact info under        Signed, Sinclair Grooms, MD  11/12/2020, 3:31 PM

## 2020-11-12 NOTE — Progress Notes (Signed)
Discharge instructions provided.  IV DC'd without incident.  Patient and family have no questions or concerns at this time.

## 2020-11-12 NOTE — Discharge Summary (Signed)
Physician Discharge Summary  Raymond Grant VVO:[id number] DOB: [birth year] DOA: 11/10/2020  PCP: Leanna Battles, MD  Admit date: 11/10/2020 Discharge date: 11/12/2020  Admitted From: home Discharge disposition: home   Recommendations for Outpatient Follow-Up:   1. Monitor for GB symptoms- may need general surgery referral 2. Added PPI daily for now with improvement in hospital 3. BP very labile-- patient to monitor at home   Discharge Diagnosis:   Principal Problem:   Chest pain Active Problems:   Peptic ulcer disease   Diabetes mellitus without complication ([place])   Hyperlipidemia associated with type 2 diabetes mellitus ([place])   History of laryngeal cancer   Hypertension associated with diabetes ([place])    Discharge Condition: Improved.  Diet recommendation: Low sodium, heart healthy  Wound care: None.  Code status: DNR   History of Present Illness:  Raymond Grant is a 86 y.o. Spanish-speaking male with medical history significant for history of laryngeal cancer s/p XRT/chemotherapy, diet-controlled type 2 diabetes, hypertension, hyperlipidemia, peptic ulcer disease with history of perforated ulcer requiring laparotomy who presents to the ED for evaluation of chest pain.  Son is at bedside to provide further history and help with interpretation when needed.  They declined the use of interpreter services.  Patient states he was in his usual state of health.  He went to see his PCP this morning for annual checkup and was told everything looked normal.  He says he was given a prescription to start Flomax for prostate issues.  After he got home he ate breakfast and then was cleaning dishes around 10:30 AM when he had sudden onset of central low sternal burning type chest pain.  He says his pain did not radiate.  He went to sit down and pain persisted and was somewhat worse.  He felt flushed and diaphoretic.  He appeared pale per family.  He did not  have any nausea, vomiting, cough, or shortness of breath.  He says he has also been having heartburn but this felt different.  He denies any previous known heart issues.  He says the only medications he is taking regularly are simvastatin and terazosin.  He uses Mylanta as needed for heartburn.   Hospital Course by Problem:   Chest discomfort with minimal elevation in troponin I:  -Low risk myocardial perfusion imaging.  Risk modification and use of sublingual nitroglycerin if recurrent chest discomfort.  If recurring symptoms, coronary angiography should be considered.  cardiology consult appreciated  Gallbladder Hydrops of uncertain significance.   -Discussed with GS- patient not having any symptoms, can follow up outpatient.  Red flag symptoms provided to pateint  Labile blood pressure recordings during this admission.   -Discontinue carvedilol.  Close outpatient follow up (patinet has legs crossed during some checks)  Elevated creatinine: -improved with IVF  Type 2 diabetes: A1c: 6.3  Peptic ulcer disease/GERD:  Ulcer was 50+ years ago Patient reports a history of perforated bleeding ulcer in the past requiring laparotomy for surgical repair.  -takes Mylanta PRN -no recent EGD in our system -Start oral Protonix 40 mg daily  History of laryngeal cancer s/p XRT/chemotherapyin 2007: Following with ENT, Dr. Carol Ada. Stable and managed with observation now.  BPH: Continue Terazosin.  Medical Consultants:   cards   Discharge Exam:   Vitals:   11/12/20 1400 11/12/20 1654  BP: (!) 98/55 (!) 182/78  Pulse:  79  Resp:  18  Temp:  98.3 F (36.8 C)  SpO2: 91% 96%  Vitals:   11/12/20 1201 11/12/20 1323 11/12/20 1400 11/12/20 1654  BP: 131/65 (!) 92/51 (!) 98/55 (!) 182/78  Pulse:  81  79  Resp:  16  18  Temp:  98.1 F (36.7 C)  98.3 F (36.8 C)  TempSrc:  Oral  Oral  SpO2:  (!) 85% 91% 96%  Weight:      Height:        General exam: Appears calm  and comfortable. .    The results of significant diagnostics from this hospitalization (including imaging, microbiology, ancillary and laboratory) are listed below for reference.     Procedures and Diagnostic Studies:   DG Chest 2 View  Result Date: 11/10/2020 CLINICAL DATA:  Episode of chest pain EXAM: CHEST - 2 VIEW COMPARISON:  2017 FINDINGS: Likely chronic interstitial prominence. Eventration the right hemidiaphragm. No pleural effusion or pneumothorax. Normal heart size. No acute osseous abnormality. IMPRESSION: No acute process in the chest. Electronically Signed   By: Macy Mis M.D.   On: 11/10/2020 14:44   ECHOCARDIOGRAM COMPLETE  Result Date: 11/11/2020    ECHOCARDIOGRAM REPORT   Patient Name:   Raymond Grant Date of Exam: 11/11/2020 Medical Rec #:  [id number]            Height:       66.0 in Accession #:    [id number]           Weight:       157.0 lb Date of Birth:  02/23/1934            BSA:          1.804 m Patient Age:    15 years             BP:           151/79 mmHg Patient Gender: M                    HR:           72 bpm. Exam Location:  Inpatient Procedure: 2D Echo, Cardiac Doppler and Color Doppler Indications:    Chest pain  History:        Patient has no prior history of Echocardiogram examinations.                 Signs/Symptoms:Chest Pain; Risk Factors:Hypertension, Diabetes                 and Dyslipidemia.  Sonographer:    Dustin Flock Referring Phys: [id number] Rote  1. Left ventricular ejection fraction, by estimation, is 60 to 65%. Left ventricular ejection fraction by PLAX is 62 %. The left ventricle has normal function. The left ventricle has no regional wall motion abnormalities. There is mild concentric left ventricular hypertrophy. Left ventricular diastolic parameters are consistent with Grade I diastolic dysfunction (impaired relaxation).  2. Right ventricular systolic function is normal. The right ventricular size is normal.  3.  The mitral valve is grossly normal. No evidence of mitral valve regurgitation.  4. The aortic valve is tricuspid. There is moderate calcification of the aortic valve. Aortic valve regurgitation is mild. Mild to moderate aortic valve stenosis. Aortic valve mean gradient measures 21.5 mmHg.  5. The inferior vena cava is normal in size with <50% respiratory variability, suggesting right atrial pressure of 8 mmHg. Comparison(s): No prior Echocardiogram. FINDINGS  Left Ventricle: LWMI 118 g/m2; RWT 0.62. Calcified papillary muscle noted. Left ventricular ejection fraction, by estimation, is 60 to  65%. Left ventricular ejection fraction by PLAX is 62 %. The left ventricle has normal function. The left ventricle has no regional wall motion abnormalities. The left ventricular internal cavity size was normal in size. There is mild concentric left ventricular hypertrophy. Left ventricular diastolic parameters are consistent with Grade I diastolic dysfunction (impaired relaxation). Right Ventricle: The right ventricular size is normal. No increase in right ventricular wall thickness. Right ventricular systolic function is normal. Left Atrium: Left atrial size was normal in size. Right Atrium: Right atrial size was normal in size. Pericardium: There is no evidence of pericardial effusion. Mitral Valve: The mitral valve is grossly normal. There is mild thickening of the mitral valve leaflet(s). No evidence of mitral valve regurgitation. Tricuspid Valve: The tricuspid valve is grossly normal. Tricuspid valve regurgitation is not demonstrated. Aortic Valve: SVi 38 mL/m2. The aortic valve is tricuspid. There is moderate calcification of the aortic valve. Aortic valve regurgitation is mild. Mild to moderate aortic stenosis is present. Aortic valve mean gradient measures 21.5 mmHg. Aortic valve peak gradient measures 35.6 mmHg. Aortic valve area, by VTI measures 0.97 cm. Pulmonic Valve: The pulmonic valve was grossly normal.  Pulmonic valve regurgitation is trivial. Aorta: The aortic root is normal in size and structure. Venous: The pulmonary veins were not well visualized. The inferior vena cava is normal in size with less than 50% respiratory variability, suggesting right atrial pressure of 8 mmHg. IAS/Shunts: The atrial septum is grossly normal.  LEFT VENTRICLE PLAX 2D LV EF:         Left            Diastology                ventricular     LV e' medial:    4.13 cm/s                ejection        LV E/e' medial:  16.0                fraction by     LV e' lateral:   6.53 cm/s                PLAX is 62      LV E/e' lateral: 10.1                %. LVIDd:         4.20 cm LVIDs:         2.80 cm LV PW:         1.30 cm LV IVS:        1.30 cm LVOT diam:     2.10 cm LV SV:         65 LV SV Index:   36 LVOT Area:     3.46 cm  RIGHT VENTRICLE RV Basal diam:  2.90 cm RV S prime:     14.90 cm/s TAPSE (M-mode): 2.8 cm LEFT ATRIUM             Index       RIGHT ATRIUM           Index LA diam:        3.50 cm 1.94 cm/m  RA Area:     17.90 cm LA Vol (A2C):   48.1 ml 26.66 ml/m RA Volume:   46.20 ml  25.61 ml/m LA Vol (A4C):   43.3 ml 24.00 ml/m LA Biplane Vol: 46.3 ml 25.66 ml/m  AORTIC VALVE AV  Area (Vmax):    1.02 cm AV Area (Vmean):   0.84 cm AV Area (VTI):     0.97 cm AV Vmax:           298.50 cm/s AV Vmean:          221.000 cm/s AV VTI:            0.674 m AV Peak Grad:      35.6 mmHg AV Mean Grad:      21.5 mmHg LVOT Vmax:         88.30 cm/s LVOT Vmean:        53.700 cm/s LVOT VTI:          0.188 m LVOT/AV VTI ratio: 0.28  AORTA Ao Root diam: 3.20 cm MITRAL VALVE MV Area (PHT): 3.21 cm     SHUNTS MV Decel Time: 236 msec     Systemic VTI:  0.19 m MV E velocity: 66.20 cm/s   Systemic Diam: 2.10 cm MV A velocity: 121.00 cm/s MV E/A ratio:  0.55 Rudean Haskell MD Electronically signed by Rudean Haskell MD Signature Date/Time: 11/11/2020/1:11:06 PM    Final    US Abdomen Limited RUQ (LIVER/GB)  Result Date:  11/11/2020 CLINICAL DATA:  Right upper quadrant abdominal pain today. EXAM: ULTRASOUND ABDOMEN LIMITED RIGHT UPPER QUADRANT COMPARISON:  None. FINDINGS: Gallbladder: The gallbladder is distended, measuring up to 10 cm in length. No evidence of gallstones, gallbladder wall thickening or sonographic Murphy sign. Common bile duct: Diameter: 6 mm Liver: No focal lesion identified. Within normal limits in parenchymal echogenicity. Portal vein is patent on color Doppler imaging with normal direction of blood flow towards the liver. Other: None. IMPRESSION: 1. Gallbladder hydrops could indicate chronic obstruction of the cystic duct. 2. No signs of acute cholecystitis, cholelithiasis or biliary dilatation. Electronically Signed   By: Richardean Sale M.D.   On: 11/11/2020 19:05     Labs:   Basic Metabolic Panel: Recent Labs  Lab 11/10/20 1348 11/11/20 0250  NA 135 137  K 4.1 3.7  CL 102 105  CO2 21* 24  GLUCOSE 116* 128*  BUN 25* 15  CREATININE 1.44* 1.24  CALCIUM 8.9 8.6*   GFR Estimated Creatinine Clearance: 40 mL/min (by C-G formula based on SCr of 1.24 mg/dL). Liver Function Tests: Recent Labs  Lab 11/10/20 1348 11/12/20 0350  AST 26 22  ALT 17 13  ALKPHOS 76 72  BILITOT 0.5 0.9  PROT 6.6 6.2*  ALBUMIN 3.7 3.4*   Recent Labs  Lab 11/10/20 1348 11/11/20 1806 11/12/20 0350  LIPASE 19 21  --   AMYLASE  --   --  29   No results for input(s): AMMONIA in the last 168 hours. Coagulation profile No results for input(s): INR, PROTIME in the last 168 hours.  CBC: Recent Labs  Lab 11/10/20 1348 11/11/20 0250  WBC 6.1 4.7  HGB 12.0* 11.2*  HCT 37.7* 35.1*  MCV 90.6 90.5  PLT 160 158   Cardiac Enzymes: No results for input(s): CKTOTAL, CKMB, CKMBINDEX, TROPONINI in the last 168 hours. BNP: Invalid input(s): POCBNP CBG: No results for input(s): GLUCAP in the last 168 hours. D-Dimer No results for input(s): DDIMER in the last 72 hours. Hgb A1c Recent Labs     11/11/20 0250  HGBA1C 6.3*   Lipid Profile Recent Labs    11/11/20 0250  CHOL 143  HDL 70  LDLCALC 47  TRIG 130  CHOLHDL 2.0   Thyroid function studies No results for input(s):  TSH, T4TOTAL, T3FREE, THYROIDAB in the last 72 hours.  Invalid input(s): FREET3 Anemia work up No results for input(s): VITAMINB12, FOLATE, FERRITIN, TIBC, IRON, RETICCTPCT in the last 72 hours. Microbiology Recent Results (from the past 240 hour(s))  Respiratory Panel by RT PCR (Flu A&B, Covid) - Nasopharyngeal Swab     Status: None   Collection Time: 11/10/20  9:08 PM   Specimen: Nasopharyngeal Swab  Result Value Ref Range Status   SARS Coronavirus 2 by RT PCR NEGATIVE NEGATIVE Final    Comment: (NOTE) SARS-CoV-2 target nucleic acids are NOT DETECTED.  The SARS-CoV-2 RNA is generally detectable in upper respiratoy specimens during the acute phase of infection. The lowest concentration of SARS-CoV-2 viral copies this assay can detect is 131 copies/mL. A negative result does not preclude SARS-Cov-2 infection and should not be used as the sole basis for treatment or other patient management decisions. A negative result may occur with  improper specimen collection/handling, submission of specimen other than nasopharyngeal swab, presence of viral mutation(s) within the areas targeted by this assay, and inadequate number of viral copies (<131 copies/mL). A negative result must be combined with clinical observations, patient history, and epidemiological information. The expected result is Negative.  Fact Sheet for Patients:  PinkCheek.be  Fact Sheet for Healthcare Providers:  GravelBags.it  This test is no t yet approved or cleared by the [place] FDA and  has been authorized for detection and/or diagnosis of SARS-CoV-2 by FDA under an Emergency Use Authorization (EUA). This EUA will remain  in effect (meaning this test can be used)  for the duration of the COVID-19 declaration under Section 564(b)(1) of the Act, 21 U.S.C. section 360bbb-3(b)(1), unless the authorization is terminated or revoked sooner.     Influenza A by PCR NEGATIVE NEGATIVE Final   Influenza B by PCR NEGATIVE NEGATIVE Final    Comment: (NOTE) The Xpert Xpress SARS-CoV-2/FLU/RSV assay is intended as an aid in  the diagnosis of influenza from Nasopharyngeal swab specimens and  should not be used as a sole basis for treatment. Nasal washings and  aspirates are unacceptable for Xpert Xpress SARS-CoV-2/FLU/RSV  testing.  Fact Sheet for Patients: PinkCheek.be  Fact Sheet for Healthcare Providers: GravelBags.it  This test is not yet approved or cleared by the [place] FDA and  has been authorized for detection and/or diagnosis of SARS-CoV-2 by  FDA under an Emergency Use Authorization (EUA). This EUA will remain  in effect (meaning this test can be used) for the duration of the  Covid-19 declaration under Section 564(b)(1) of the Act, 21  U.S.C. section 360bbb-3(b)(1), unless the authorization is  terminated or revoked. Performed at [facility], [place] [address], [place],  [address]      Discharge Instructions:   Discharge Instructions    Diet - low sodium heart healthy   Complete by: As directed    Discharge instructions   Complete by: As directed    Can use protonix or over the counter proton pump inhibitor -low fat diet -outpatient follow up with PCP for gallbladder symptoms: most worrisome are fever, Nausea and vomiting/inability to eat, RUQ pain (can radiate to shoulder)   Increase activity slowly   Complete by: As directed      Allergies as of 11/12/2020   No Known Allergies     Medication List    TAKE these medications   acetaminophen 500 MG tablet Commonly known as: TYLENOL Take 500 mg by mouth every 6 (six) hours as  needed for mild pain.    guaiFENesin 100 MG/5ML Soln Commonly known as: ROBITUSSIN Take 5 mLs by mouth every 4 (four) hours as needed for cough or to loosen phlegm.   nitroGLYCERIN 0.4 MG SL tablet Commonly known as: Nitrostat Place 1 tablet (0.4 mg total) under the tongue every 5 (five) minutes as needed for chest pain.   pantoprazole 40 MG tablet Commonly known as: PROTONIX Take 1 tablet (40 mg total) by mouth daily. Start taking on: November 13, 2020   simvastatin 80 MG tablet Commonly known as: ZOCOR Take 80 mg by mouth at bedtime.   terazosin 2 MG capsule Commonly known as: HYTRIN Take 2 mg by mouth at bedtime.       Follow-up Information    Leanna Battles, MD Follow up in 1 week(s).   Specialty: Internal Medicine Contact information: [address] [place] [address] [phone]                Time coordinating discharge: 35 min  Signed:  Geradine Girt DO  [facility] 11/12/2020, 5:25 PM

## 2020-11-12 NOTE — Progress Notes (Signed)
Patient returned to unit and offered meal.  BP assessed, patient states he "feels great" following stress test today.  Medical provider aware of vital signs.  Son informed of plan of care.

## 2020-11-12 NOTE — Progress Notes (Deleted)
Progress Note  Patient Name: Raymond Grant Date of Encounter: 11/12/2020  [facility] Cardiologist: No primary care provider on file.   Subjective   Seen in nuc med. No chest pain or shortness of breath. He fells tired.   Inpatient Medications    Scheduled Meds: . aspirin EC  81 mg Oral Daily  . atorvastatin  40 mg Oral Daily  . carvedilol  3.125 mg Oral BID WC  . enoxaparin (LOVENOX) injection  40 mg Subcutaneous Q24H  . pantoprazole  40 mg Oral Daily  . regadenoson      . terazosin  2 mg Oral QHS   Continuous Infusions:  PRN Meds: acetaminophen, benzonatate, guaiFENesin, hydrALAZINE, metoprolol tartrate, ondansetron (ZOFRAN) IV   Vital Signs    Vitals:   11/12/20 1132 11/12/20 1158 11/12/20 1159 11/12/20 1201  BP: (!) 155/76 (!) 152/73 139/68 131/65  Pulse:      Resp:      Temp:      TempSrc:      SpO2:      Weight:      Height:        Intake/Output Summary (Last 24 hours) at 11/12/2020 1210 Last data filed at 11/11/2020 2300 Gross per 24 hour  Intake 240 ml  Output --  Net 240 ml   Last 3 Weights 11/12/2020 11/11/2020 11/10/2020  Weight (lbs) 151 lb 4.8 oz 157 lb 157 lb  Weight (kg) 68.629 kg 71.215 kg 71.215 kg      Telemetry    N/A - Personally Reviewed  ECG    NSR, TWI aVL and V6, 76 bpm - Personally Reviewed  Physical Exam   GEN: No acute distress.   Neck: No JVD Cardiac: RRR, no murmurs, rubs, or gallops.  Respiratory: Clear to auscultation bilaterally. GI: Soft, nontender, non-distended  MS: No edema; No deformity. Neuro:  Nonfocal  Psych: Normal affect   Labs    High Sensitivity Troponin:   Recent Labs  Lab 11/10/20 1348 11/10/20 1548 11/10/20 1948 11/10/20 2137  TROPONINIHS 12 44* 65* 61*      Chemistry Recent Labs  Lab 11/10/20 1348 11/11/20 0250 11/12/20 0350  NA 135 137  --   K 4.1 3.7  --   CL 102 105  --   CO2 21* 24  --   GLUCOSE 116* 128*  --   BUN 25* 15  --   CREATININE 1.44* 1.24  --    CALCIUM 8.9 8.6*  --   PROT 6.6  --  6.2*  ALBUMIN 3.7  --  3.4*  AST 26  --  22  ALT 17  --  13  ALKPHOS 76  --  72  BILITOT 0.5  --  0.9  GFRNONAA 48* 57*  --   ANIONGAP 12 8  --      Hematology Recent Labs  Lab 11/10/20 1348 11/11/20 0250  WBC 6.1 4.7  RBC 4.16* 3.88*  HGB 12.0* 11.2*  HCT 37.7* 35.1*  MCV 90.6 90.5  MCH 28.8 28.9  MCHC 31.8 31.9  RDW 13.7 13.6  PLT 160 158    BNP Recent Labs  Lab 11/11/20 1806  BNP 189.8*     DDimer No results for input(s): DDIMER in the last 168 hours.   Radiology    DG Chest 2 View  Result Date: 11/10/2020 CLINICAL DATA:  Episode of chest pain EXAM: CHEST - 2 VIEW COMPARISON:  2017 FINDINGS: Likely chronic interstitial prominence. Eventration the right hemidiaphragm. No pleural effusion  or pneumothorax. Normal heart size. No acute osseous abnormality. IMPRESSION: No acute process in the chest. Electronically Signed   By: Macy Mis M.D.   On: 11/10/2020 14:44   ECHOCARDIOGRAM COMPLETE  Result Date: 11/11/2020    ECHOCARDIOGRAM REPORT   Patient Name:   JACKEY HOUSEY Date of Exam: 11/11/2020 Medical Rec #:  [id number]            Height:       66.0 in Accession #:    [id number]           Weight:       157.0 lb Date of Birth:  1934-06-07            BSA:          1.804 m Patient Age:    54 years             BP:           151/79 mmHg Patient Gender: M                    HR:           72 bpm. Exam Location:  Inpatient Procedure: 2D Echo, Cardiac Doppler and Color Doppler Indications:    Chest pain  History:        Patient has no prior history of Echocardiogram examinations.                 Signs/Symptoms:Chest Pain; Risk Factors:Hypertension, Diabetes                 and Dyslipidemia.  Sonographer:    Dustin Flock Referring Phys: [id number] [place]  1. Left ventricular ejection fraction, by estimation, is 60 to 65%. Left ventricular ejection fraction by PLAX is 62 %. The left ventricle has normal function.  The left ventricle has no regional wall motion abnormalities. There is mild concentric left ventricular hypertrophy. Left ventricular diastolic parameters are consistent with Grade I diastolic dysfunction (impaired relaxation).  2. Right ventricular systolic function is normal. The right ventricular size is normal.  3. The mitral valve is grossly normal. No evidence of mitral valve regurgitation.  4. The aortic valve is tricuspid. There is moderate calcification of the aortic valve. Aortic valve regurgitation is mild. Mild to moderate aortic valve stenosis. Aortic valve mean gradient measures 21.5 mmHg.  5. The inferior vena cava is normal in size with <50% respiratory variability, suggesting right atrial pressure of 8 mmHg. Comparison(s): No prior Echocardiogram. FINDINGS  Left Ventricle: LWMI 118 g/m2; RWT 0.62. Calcified papillary muscle noted. Left ventricular ejection fraction, by estimation, is 60 to 65%. Left ventricular ejection fraction by PLAX is 62 %. The left ventricle has normal function. The left ventricle has no regional wall motion abnormalities. The left ventricular internal cavity size was normal in size. There is mild concentric left ventricular hypertrophy. Left ventricular diastolic parameters are consistent with Grade I diastolic dysfunction (impaired relaxation). Right Ventricle: The right ventricular size is normal. No increase in right ventricular wall thickness. Right ventricular systolic function is normal. Left Atrium: Left atrial size was normal in size. Right Atrium: Right atrial size was normal in size. Pericardium: There is no evidence of pericardial effusion. Mitral Valve: The mitral valve is grossly normal. There is mild thickening of the mitral valve leaflet(s). No evidence of mitral valve regurgitation. Tricuspid Valve: The tricuspid valve is grossly normal. Tricuspid valve regurgitation is not demonstrated. Aortic Valve: SVi 38 mL/m2. The aortic valve  is tricuspid. There is  moderate calcification of the aortic valve. Aortic valve regurgitation is mild. Mild to moderate aortic stenosis is present. Aortic valve mean gradient measures 21.5 mmHg. Aortic valve peak gradient measures 35.6 mmHg. Aortic valve area, by VTI measures 0.97 cm. Pulmonic Valve: The pulmonic valve was grossly normal. Pulmonic valve regurgitation is trivial. Aorta: The aortic root is normal in size and structure. Venous: The pulmonary veins were not well visualized. The inferior vena cava is normal in size with less than 50% respiratory variability, suggesting right atrial pressure of 8 mmHg. IAS/Shunts: The atrial septum is grossly normal.  LEFT VENTRICLE PLAX 2D LV EF:         Left            Diastology                ventricular     LV e' medial:    4.13 cm/s                ejection        LV E/e' medial:  16.0                fraction by     LV e' lateral:   6.53 cm/s                PLAX is 62      LV E/e' lateral: 10.1                %. LVIDd:         4.20 cm LVIDs:         2.80 cm LV PW:         1.30 cm LV IVS:        1.30 cm LVOT diam:     2.10 cm LV SV:         65 LV SV Index:   36 LVOT Area:     3.46 cm  RIGHT VENTRICLE RV Basal diam:  2.90 cm RV S prime:     14.90 cm/s TAPSE (M-mode): 2.8 cm LEFT ATRIUM             Index       RIGHT ATRIUM           Index LA diam:        3.50 cm 1.94 cm/m  RA Area:     17.90 cm LA Vol (A2C):   48.1 ml 26.66 ml/m RA Volume:   46.20 ml  25.61 ml/m LA Vol (A4C):   43.3 ml 24.00 ml/m LA Biplane Vol: 46.3 ml 25.66 ml/m  AORTIC VALVE AV Area (Vmax):    1.02 cm AV Area (Vmean):   0.84 cm AV Area (VTI):     0.97 cm AV Vmax:           298.50 cm/s AV Vmean:          221.000 cm/s AV VTI:            0.674 m AV Peak Grad:      35.6 mmHg AV Mean Grad:      21.5 mmHg LVOT Vmax:         88.30 cm/s LVOT Vmean:        53.700 cm/s LVOT VTI:          0.188 m LVOT/AV VTI ratio: 0.28  AORTA Ao Root diam: 3.20 cm MITRAL VALVE MV Area (PHT): 3.21 cm     SHUNTS MV Decel Time:  236 msec      Systemic VTI:  0.19 m MV E velocity: 66.20 cm/s   Systemic Diam: 2.10 cm MV A velocity: 121.00 cm/s MV E/A ratio:  0.55 Rudean Haskell MD Electronically signed by Rudean Haskell MD Signature Date/Time: Nov 20, 2020/1:11:06 PM    Final    US Abdomen Limited RUQ (LIVER/GB)  Result Date: 11-20-2020 CLINICAL DATA:  Right upper quadrant abdominal pain today. EXAM: ULTRASOUND ABDOMEN LIMITED RIGHT UPPER QUADRANT COMPARISON:  None. FINDINGS: Gallbladder: The gallbladder is distended, measuring up to 10 cm in length. No evidence of gallstones, gallbladder wall thickening or sonographic Murphy sign. Common bile duct: Diameter: 6 mm Liver: No focal lesion identified. Within normal limits in parenchymal echogenicity. Portal vein is patent on color Doppler imaging with normal direction of blood flow towards the liver. Other: None. IMPRESSION: 1. Gallbladder hydrops could indicate chronic obstruction of the cystic duct. 2. No signs of acute cholecystitis, cholelithiasis or biliary dilatation. Electronically Signed   By: Richardean Sale M.D.   On: 20-Nov-2020 19:05    Cardiac Studies   Lexiscan pending  Echo 11-20-20 1. Left ventricular ejection fraction, by estimation, is 60 to 65%. Left  ventricular ejection fraction by PLAX is 62 %. The left ventricle has  normal function. The left ventricle has no regional wall motion  abnormalities. There is mild concentric left  ventricular hypertrophy. Left ventricular diastolic parameters are  consistent with Grade I diastolic dysfunction (impaired relaxation).  2. Right ventricular systolic function is normal. The right ventricular  size is normal.  3. The mitral valve is grossly normal. No evidence of mitral valve  regurgitation.  4. The aortic valve is tricuspid. There is moderate calcification of the  aortic valve. Aortic valve regurgitation is mild. Mild to moderate aortic  valve stenosis. Aortic valve mean gradient measures 21.5 mmHg.  5. The  inferior vena cava is normal in size with <50% respiratory  variability, suggesting right atrial pressure of 8 mmHg.   Comparison(s): No prior Echocardiogram.   Patient Profile     86 y.o. male with a PMH of HTN, HLD, DM type 2, PUD with hx of perforated ulcer requiring laparotomy, and laryngeal cancer s/p chemo/XRT, who is being seen today for the evaluation of chest pain   Assessment & Plan   Chest pain - presented with exertional chest pain described as a burning - HS troponin peaked at 65 - EKG with no acute changes - Echo showed EF60-65%, G1DD - BP was elevated on admission, possible that this is demand ischemia 2/2 to hypertension - RF for CAD include HTN, HLD, DM2. Patient also has history of GERD - RUQ US showed no acute cholecystitis - pending Myoview lexiscan - continue aspirin, statin, BB (started on coreg 3.125mg  BID)  HTN - markedly elevated on admission - started on amlodipine 10mg  daily and coreg 3.125mg  BID - Bps improved - IV hydralazine  HLD - LDL 47 - continue statin  PUD with hx of ulcer  - continue PPI  AKI/CKD stage 3 - improved with IVF  DM2 - A1C 6.3 - SSI - per IM    For questions or updates, please contact [facility] Please consult [email or web address] for contact info under        Signed, Cadence Ninfa Meeker, PA-C  11/12/2020, 12:10 PM

## 2020-11-12 NOTE — Progress Notes (Addendum)
   Iziah Cates presented for a Lexiscan nuclear stress test today.  No immediate complications.  Stress imaging is pending at this time.  Preliminary EKG findings may be listed in the chart, but the stress test result will not be finalized until perfusion imaging is complete.  Cidney Kirkwood Ninfa Meeker, PA-C  11/12/2020, 12:03 PM

## 2020-11-12 NOTE — Plan of Care (Signed)
  Problem: Safety: Goal: Ability to remain free from injury will improve Outcome: Progressing   

## 2020-11-24 DIAGNOSIS — R0989 Other specified symptoms and signs involving the circulatory and respiratory systems: Secondary | ICD-10-CM | POA: Diagnosis not present

## 2020-11-24 DIAGNOSIS — Z8521 Personal history of malignant neoplasm of larynx: Secondary | ICD-10-CM | POA: Diagnosis not present

## 2020-11-24 DIAGNOSIS — R7989 Other specified abnormal findings of blood chemistry: Secondary | ICD-10-CM | POA: Diagnosis not present

## 2020-11-24 DIAGNOSIS — E1151 Type 2 diabetes mellitus with diabetic peripheral angiopathy without gangrene: Secondary | ICD-10-CM | POA: Diagnosis not present

## 2020-11-24 DIAGNOSIS — N400 Enlarged prostate without lower urinary tract symptoms: Secondary | ICD-10-CM | POA: Diagnosis not present

## 2020-11-24 DIAGNOSIS — K821 Hydrops of gallbladder: Secondary | ICD-10-CM | POA: Diagnosis not present

## 2020-11-24 DIAGNOSIS — K279 Peptic ulcer, site unspecified, unspecified as acute or chronic, without hemorrhage or perforation: Secondary | ICD-10-CM | POA: Diagnosis not present

## 2020-11-24 DIAGNOSIS — R0789 Other chest pain: Secondary | ICD-10-CM | POA: Diagnosis not present

## 2021-01-06 DIAGNOSIS — J387 Other diseases of larynx: Secondary | ICD-10-CM | POA: Diagnosis not present

## 2021-01-06 DIAGNOSIS — J384 Edema of larynx: Secondary | ICD-10-CM | POA: Diagnosis not present

## 2021-01-06 DIAGNOSIS — J383 Other diseases of vocal cords: Secondary | ICD-10-CM | POA: Diagnosis not present

## 2021-01-06 DIAGNOSIS — Z923 Personal history of irradiation: Secondary | ICD-10-CM | POA: Diagnosis not present

## 2021-01-06 DIAGNOSIS — R1314 Dysphagia, pharyngoesophageal phase: Secondary | ICD-10-CM | POA: Diagnosis not present

## 2021-01-06 DIAGNOSIS — Z8521 Personal history of malignant neoplasm of larynx: Secondary | ICD-10-CM | POA: Diagnosis not present

## 2021-12-20 ENCOUNTER — Encounter (HOSPITAL_COMMUNITY): Payer: Self-pay

## 2021-12-20 ENCOUNTER — Other Ambulatory Visit: Payer: Self-pay

## 2021-12-20 ENCOUNTER — Ambulatory Visit (HOSPITAL_COMMUNITY)
Admission: EM | Admit: 2021-12-20 | Discharge: 2021-12-20 | Disposition: A | Discharge status: Home / Self Care | Payer: Medicare Other | Attending: Family Medicine | Admitting: Family Medicine

## 2021-12-20 DIAGNOSIS — M542 Cervicalgia: Secondary | ICD-10-CM

## 2021-12-20 HISTORY — DX: Pneumonia, unspecified organism: J18.9

## 2021-12-20 MED ORDER — TIZANIDINE HCL 2 MG PO TABS: 2.0000 mg | ORAL_TABLET | Freq: Three times a day (TID) | ORAL | 0 refills | Status: AC | PRN

## 2021-12-20 MED ORDER — KETOROLAC TROMETHAMINE 30 MG/ML IJ SOLN
30.0000 mg | Freq: Once | INTRAMUSCULAR | Status: AC
  Administered 2021-12-20: 14:00:00 30 mg via INTRAMUSCULAR

## 2021-12-20 MED ORDER — KETOROLAC TROMETHAMINE 30 MG/ML IJ SOLN
INTRAMUSCULAR | Status: AC
  Filled 2021-12-20: qty 1

## 2021-12-20 NOTE — ED Provider Notes (Addendum)
Raymond Grant    CSN: [id number] Arrival date & time: 12/20/21  1118      History   Chief Complaint Chief Complaint  Patient presents with   Neck Pain    HPI Raymond Grant is a 87 y.o. male.    Neck Pain Here for a 3 day h/o neck pain, extending onto upper back. Hurts to bend down and to turn his head to either side. No injury or trauma. He has been having less sleep due to helping care for his wife who has dementia.   Was diagnosed with pneumonia on 12/22 at the [place], and is feeling better, with less cough and more energy.  He has had recent imaging of his neck/thoracic spine, and did have arthritis. History obtained from daughter and pt.   Past Medical History:  Diagnosis Date   Arthritis    Cancer ([place]) 2007   throat   Diabetes mellitus without complication (HCC)    GERD (gastroesophageal reflux disease)    Hyperlipidemia    Peptic ulcer disease    Pneumonia     Patient Active Problem List   Diagnosis Date Noted   Chest pain 11/10/2020   Diabetes mellitus without complication ([place])    Hyperlipidemia associated with type 2 diabetes mellitus ([place])    Hypertension associated with diabetes ([place])    Pharyngoesophageal dysphagia 08/14/2020   History of laryngeal cancer 05/28/2019   Peptic ulcer disease     Past Surgical History:  Procedure Laterality Date   ABDOMINAL SURGERY     HERNIA REPAIR     PEG TUBE PLACEMENT     PEG TUBE REMOVAL     REPAIR OF PERFORATED ULCER         Home Medications    Prior to Admission medications   Medication Sig Start Date End Date Taking? Authorizing Provider  simvastatin (ZOCOR) 80 MG tablet Take 80 mg by mouth at bedtime.   Yes [provider]  tiZANidine (ZANAFLEX) 2 MG tablet Take 1 tablet (2 mg total) by mouth every 8 (eight) hours as needed for muscle spasms. 12/20/21  Yes Barrett Henle, MD  acetaminophen (TYLENOL) 500 MG tablet Take 500 mg by mouth every 6 (six) hours as needed for mild  pain.    [provider]  guaiFENesin (ROBITUSSIN) 100 MG/5ML SOLN Take 5 mLs by mouth every 4 (four) hours as needed for cough or to loosen phlegm.    [provider]  nitroGLYCERIN (NITROSTAT) 0.4 MG SL tablet Place 1 tablet (0.4 mg total) under the tongue every 5 (five) minutes as needed for chest pain. 11/12/20 11/12/21  Geradine Girt, DO  pantoprazole (PROTONIX) 40 MG tablet Take 1 tablet (40 mg total) by mouth daily. 11/13/20   Geradine Girt, DO  terazosin (HYTRIN) 2 MG capsule Take 2 mg by mouth at bedtime.    [provider]    Family History Family History  Problem Relation Age of Onset   Leukemia Sister     Social History Social History   Tobacco Use   Smoking status: Never  Substance Use Topics   Alcohol use: No    Comment: former drinker   Drug use: No     Allergies   Patient has no known allergies.   Review of Systems Review of Systems  Musculoskeletal:  Positive for neck pain.    Physical Exam Triage Vital Signs ED Triage Vitals  Enc Vitals Group     BP 12/20/21 1313 (!) 177/83  Pulse Rate 12/20/21 1313 85     Resp 12/20/21 1313 17     Temp 12/20/21 1313 98.5 F (36.9 C)     Temp Source 12/20/21 1313 Oral     SpO2 12/20/21 1313 98 %     Weight --      Height --      Head Circumference --      Peak Flow --      Pain Score 12/20/21 1311 10     Pain Loc --      Pain Edu? --      Excl. in [place]? --    No data found.  Updated Vital Signs BP (!) 177/83 (BP Location: Right Arm)    Pulse 85    Temp 98.5 F (36.9 C) (Oral)    Resp 17    SpO2 98%   Visual Acuity Right Eye Distance:   Left Eye Distance:   Bilateral Distance:    Right Eye Near:   Left Eye Near:    Bilateral Near:     Physical Exam Vitals reviewed.  Constitutional:      General: He is not in acute distress.    Appearance: He is not toxic-appearing.  Eyes:     Extraocular Movements: Extraocular movements intact.  Cardiovascular:     Rate and  Rhythm: Normal rate and regular rhythm.     Heart sounds: No murmur heard. Pulmonary:     Effort: No respiratory distress.     Breath sounds: No wheezing or rhonchi.  Musculoskeletal:     Cervical back: Tenderness (bilateral trapezius) present.  Lymphadenopathy:     Cervical: No cervical adenopathy.  Skin:    Coloration: Skin is not pale.     Findings: No rash.  Neurological:     Mental Status: He is alert and oriented to person, place, and time.  Psychiatric:        Behavior: Behavior normal.     UC Treatments / Results  Labs (all labs ordered are listed, but only abnormal results are displayed) Labs Reviewed - No data to display  EKG   Radiology No results found.  Procedures Procedures (including critical care time)  Medications Ordered in UC Medications  ketorolac (TORADOL) 30 MG/ML injection 30 mg (has no administration in time range)    Initial Impression / Assessment and Plan / UC Course  I have reviewed the triage vital signs and the nursing notes.  Pertinent labs & imaging results that were available during my care of the patient were reviewed by me and considered in my medical decision making (see chart for details).     Arthrits and muscle pain, will treat with tylenol and low dose tizanidine. Final Clinical Impressions(s) / UC Diagnoses   Final diagnoses:  Neck pain     Discharge Instructions      Take tylenol 500 mg 2 every 6-8 hours as needed for pain the next few days.  Tizanidine 2 mg 1 every 8 hours as needed for muscle pain/spasm.  Rest; heating pad to the sore area.  See your regular doctor, especially if not improving.      ED Prescriptions     Medication Sig Dispense Auth. Provider   tiZANidine (ZANAFLEX) 2 MG tablet Take 1 tablet (2 mg total) by mouth every 8 (eight) hours as needed for muscle spasms. 21 tablet Rhylee Nunn, Gwenlyn Perking, MD      PDMP not reviewed this encounter.   Barrett Henle, MD 12/20/21 [phone]  Barrett Henle, MD 12/20/21 1350

## 2021-12-20 NOTE — Discharge Instructions (Signed)
Take tylenol 500 mg 2 every 6-8 hours as needed for pain the next few days.  Tizanidine 2 mg 1 every 8 hours as needed for muscle pain/spasm.  Rest; heating pad to the sore area.  See your regular doctor, especially if not improving.

## 2021-12-20 NOTE — ED Triage Notes (Signed)
Pt denies chest pain

## 2021-12-20 NOTE — ED Triage Notes (Signed)
Pt presents with pain at the neck and c/o it moving down his spine and c/o headache.   Pt reports having Pneumonia. States he was seen at the [place].   Daughter states his BP has been high, since the beginning of the pains. States he is taking antibiotics for Pneumonia.

## 2022-02-09 ENCOUNTER — Ambulatory Visit (INDEPENDENT_AMBULATORY_CARE_PROVIDER_SITE_OTHER): Payer: Medicare Other | Admitting: Internal Medicine

## 2022-02-09 ENCOUNTER — Encounter: Payer: Self-pay | Admitting: Internal Medicine

## 2022-02-09 VITALS — BP 94/50 | HR 64 | Ht 64.57 in | Wt 149.1 lb

## 2022-02-09 DIAGNOSIS — R1310 Dysphagia, unspecified: Secondary | ICD-10-CM

## 2022-02-09 DIAGNOSIS — R634 Abnormal weight loss: Secondary | ICD-10-CM | POA: Diagnosis not present

## 2022-02-09 DIAGNOSIS — K219 Gastro-esophageal reflux disease without esophagitis: Secondary | ICD-10-CM

## 2022-02-09 DIAGNOSIS — D509 Iron deficiency anemia, unspecified: Secondary | ICD-10-CM

## 2022-02-09 NOTE — Patient Instructions (Addendum)
If you are age 86 or older, your body mass index should be between 23-30. Your Body mass index is 25.15 kg/m. If this is out of the aforementioned range listed, please consider follow up with your Primary Care Provider.  If you are age 20 or younger, your body mass index should be between 19-25. Your Body mass index is 25.15 kg/m. If this is out of the aformentioned range listed, please consider follow up with your Primary Care Provider.   ________________________________________________________  The [place] [facility] providers would like to encourage you to use [facility] to communicate with providers for non-urgent requests or questions.  Due to long hold times on the telephone, sending your provider a message by [facility] may be a faster and more efficient way to get a response.  Please allow 48 business hours for a response.  Please remember that this is for non-urgent requests.  _______________________________________________________  Dennis Bast have been scheduled for an endoscopy.and colonoscopy Please follow written instructions given to you at your visit today. If you use inhalers (even only as needed), please bring them with you on the day of your procedure.

## 2022-02-10 ENCOUNTER — Encounter: Payer: Self-pay | Admitting: Internal Medicine

## 2022-02-10 NOTE — Progress Notes (Signed)
HISTORY OF PRESENT ILLNESS:  Raymond Grant is a 88 y.o. male, native of [place] and long-term [place] resident as well as retired [employer] conservation officer, who is referred by the [facility] for colonoscopy and upper endoscopy to evaluate iron deficiency anemia.  Patient does not provide much history as his English seems to be suboptimal.  However, he is accompanied by his son Raymond Grant who assists.  There are not a lot of medical records.  I do have some records from the [facility] including a GI consultation note which I have reviewed as well as some laboratory data.  Patient apparently has had hemoglobins ranging in the 12-13 range.  January 03, 2022 it was 9.8.  Further work-up revealed iron deficiency with low iron saturation and low ferritin.  Patient has been fatigued.  His son states this is secondary to the patient's caring for his demented wife.  The records say that the patient has a history of throat cancer for which she underwent radiation and chemotherapy.  The patient's son mentioned esophageal cancer.  There was no surgery.  His record also states that he has a history of peptic ulcer disease with surgery as well as prior PEG placement.  Despite his age, the patient remains active.  His GI review of systems is remarkable for dysphagia to solids.  They also report 10 pound weight loss.  He does have GERD for which she takes famotidine.  He was placed on iron 3 times daily with vitamin C.  GI review of systems is otherwise negative.  Records suggest that he may have had prior colonoscopy and upper endoscopy.  No actual reports.  The patient and his son are not sure.  Patient denies melena or hematochezia.  No abdominal pain.  Additional blood work shows normal liver tests.  Creatinine 1.47.  His record states that he was placed on pantoprazole.  They believe this to be the case.  Abdominal ultrasound from November 2021 showed gallbladder hydrops.  REVIEW OF SYSTEMS:  All non-GI ROS negative  unless otherwise stated in the HPI except for back pain, cough, headaches, hearing problems  Past Medical History:  Diagnosis Date   Anemia    Arthritis    Chronic headaches    Diabetes mellitus without complication ([place])    Esophageal cancer ([place]) 2007   throat   GERD (gastroesophageal reflux disease)    HTN (hypertension)    Hyperlipidemia    Peptic ulcer disease    Pneumonia     Past Surgical History:  Procedure Laterality Date   ABDOMINAL SURGERY     INGUINAL HERNIA REPAIR Right    PEG TUBE PLACEMENT     PEG TUBE REMOVAL     REPAIR OF PERFORATED ULCER      Social History Raymond Grant  reports that he quit smoking about 20 years ago. His smoking use included cigarettes. He has never used smokeless tobacco. He reports current alcohol use. He reports that he does not use drugs.  family history includes Hyperlipidemia in his son; Hypertension in his son; Leukemia in his sister.  No Known Allergies     PHYSICAL EXAMINATION: Vital signs: BP (!) 94/50 (BP Location: Left Arm, Patient Position: Sitting, Cuff Size: Normal)    Pulse 64    Ht 5' 4.57" (1.64 m) Comment: height measured without shoes   Wt 149 lb 2 oz (67.6 kg)    BMI 25.15 kg/m   Constitutional: generally well-appearing, no acute distress Psychiatric: alert and oriented x3,  cooperative Eyes: extraocular movements intact, anicteric, conjunctiva pink Mouth: oral pharynx moist, no lesions Neck: supple no lymphadenopathy Cardiovascular: heart regular rate and rhythm, 2/6 systolic murmur Lungs: clear to auscultation bilaterally Abdomen: soft, nontender, nondistended, no obvious ascites, no peritoneal signs, normal bowel sounds, no organomegaly Rectal: Deferred until colonoscopy Extremities: no clubbing, cyanosis, or lower extremity edema bilaterally Skin: no lesions on visible extremities Neuro: No focal deficits.  Cranial nerves intact  ASSESSMENT:  1.  Iron deficiency anemia 2.  GERD 3.  Probable  history of head neck cancer status postradiation chemotherapy and transient PEG placement. 4.  Reported history of peptic ulcer disease with surgery.  Rule out partial gastrectomy.  Rule out recurrence. 5.  General medical problems.  Stable  PLAN:  1.  Colonoscopy to evaluate iron deficiency anemia.  Patient is high risk due to his age.The nature of the procedure, as well as the risks, benefits, and alternatives were carefully and thoroughly reviewed with the patient. Ample time for discussion and questions allowed. The patient understood, was satisfied, and agreed to proceed.  2.  Upper endoscopy to evaluate iron deficiency anemia, GERD, and dysphagia.  Patient is high risk as above.The nature of the procedure, as well as the risks, benefits, and alternatives were carefully and thoroughly reviewed with the patient. Ample time for discussion and questions allowed. The patient understood, was satisfied, and agreed to proceed.  3.  Continue iron replacement therapy 4.  Reflux precautions 5.  Continue PPI 6.  Ongoing general medical care with the [facility] A total time of 60 minutes was spent preparing to see the patient, reviewing test and records, obtaining comprehensive history, performing comprehensive physical examination, counseling the patient and his son regarding the above listed issues, ordering advanced endoscopic procedures, and documenting clinical information in the health record

## 2022-02-22 DIAGNOSIS — R911 Solitary pulmonary nodule: Secondary | ICD-10-CM | POA: Diagnosis not present

## 2022-02-22 DIAGNOSIS — Z8521 Personal history of malignant neoplasm of larynx: Secondary | ICD-10-CM | POA: Diagnosis not present

## 2022-02-22 DIAGNOSIS — Z806 Family history of leukemia: Secondary | ICD-10-CM | POA: Diagnosis not present

## 2022-02-22 DIAGNOSIS — R1310 Dysphagia, unspecified: Secondary | ICD-10-CM | POA: Diagnosis not present

## 2022-02-22 DIAGNOSIS — Z87891 Personal history of nicotine dependence: Secondary | ICD-10-CM | POA: Diagnosis not present

## 2022-02-22 DIAGNOSIS — Z800 Family history of malignant neoplasm of digestive organs: Secondary | ICD-10-CM | POA: Diagnosis not present

## 2022-02-22 DIAGNOSIS — Z01818 Encounter for other preprocedural examination: Secondary | ICD-10-CM | POA: Diagnosis not present

## 2022-02-22 DIAGNOSIS — Z8042 Family history of malignant neoplasm of prostate: Secondary | ICD-10-CM | POA: Diagnosis not present

## 2022-02-22 DIAGNOSIS — R918 Other nonspecific abnormal finding of lung field: Secondary | ICD-10-CM | POA: Diagnosis not present

## 2022-03-01 IMAGING — US US ABDOMEN LIMITED
1 series · 14 of 25 positions shown · non-contrast
Comparison: None.

CLINICAL DATA: Right upper quadrant abdominal pain today.

EXAM:
ULTRASOUND ABDOMEN LIMITED RIGHT UPPER QUADRANT

[Series 1: us abdomen limited ruq (liver/gb) · 14 of 28 slices shown]
[im 1/28]
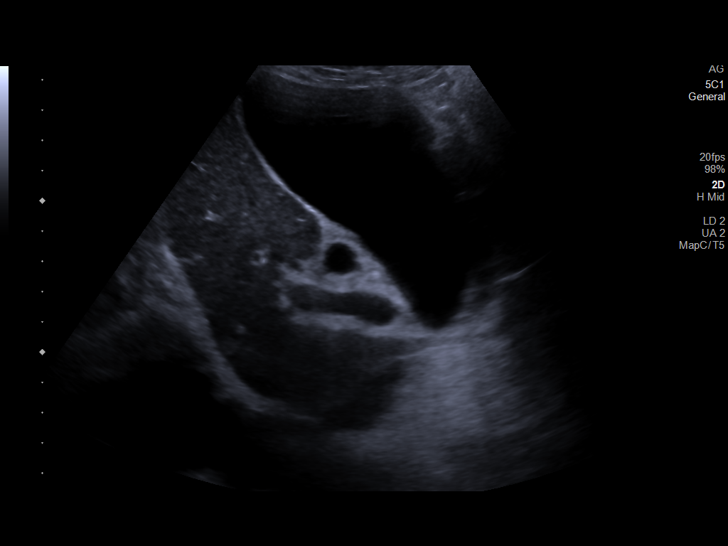
[im 3/28]
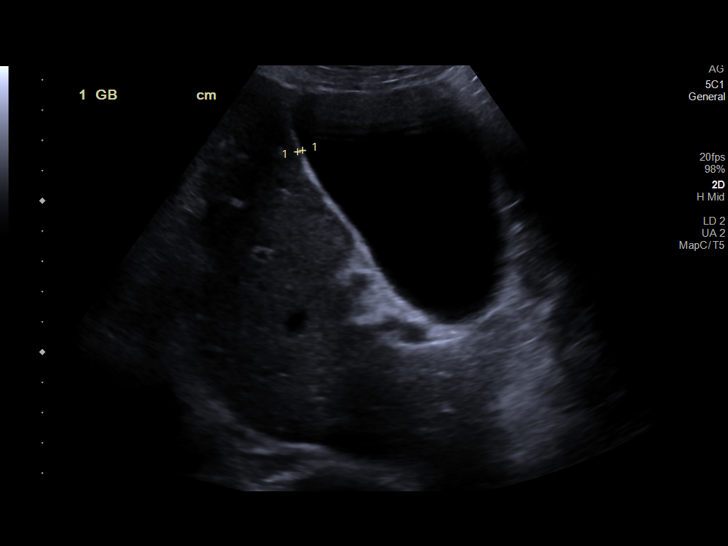
[im 5/28]
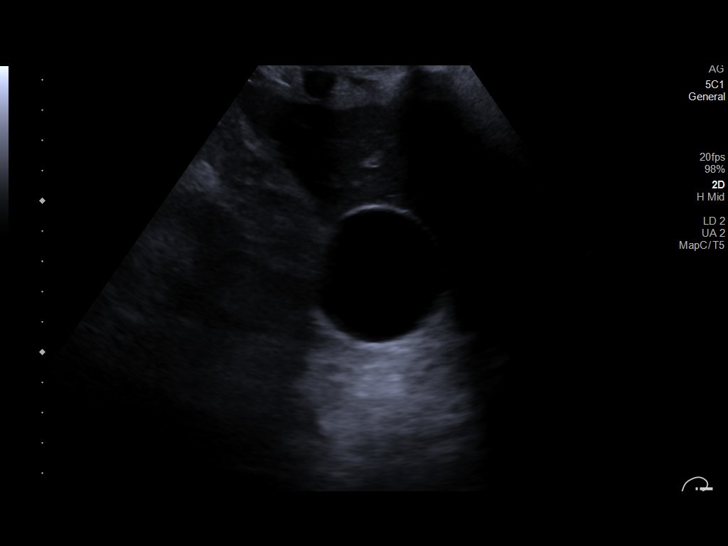
[im 7/28]
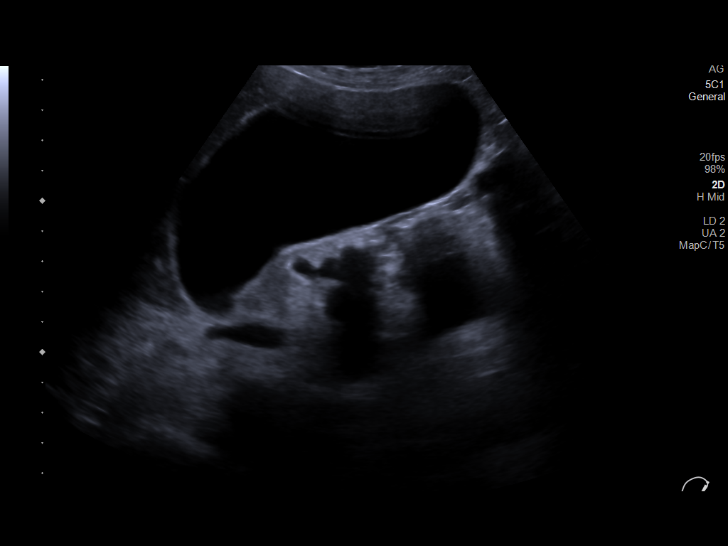
[im 10/28]
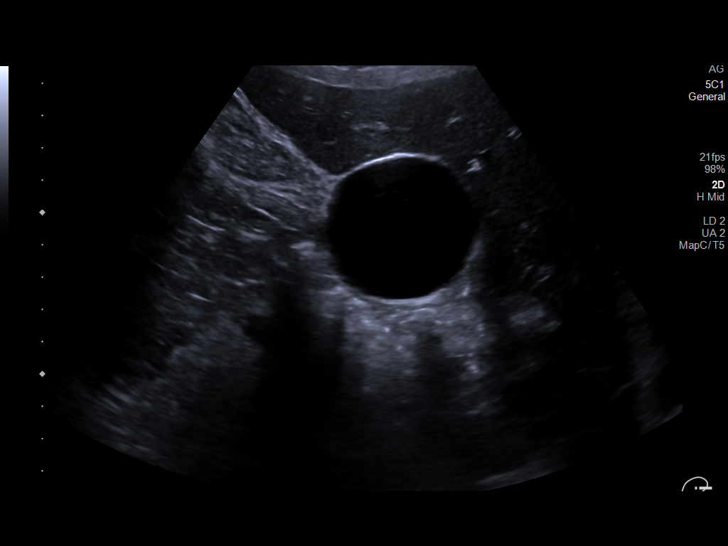
[im 11/28]
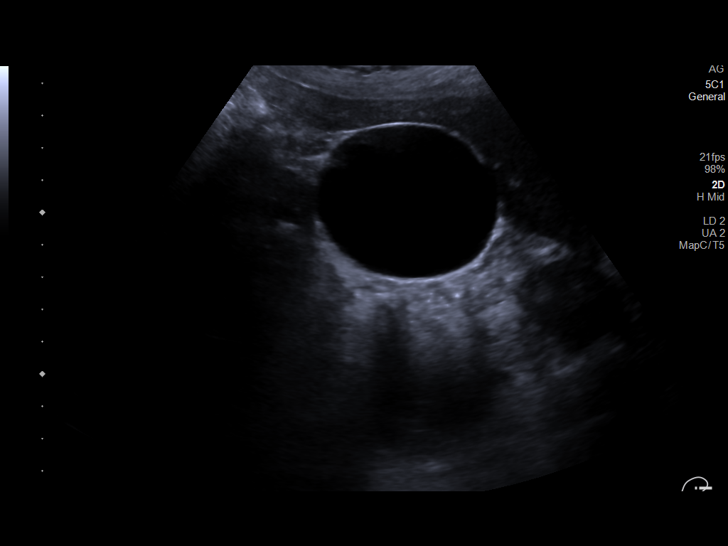
[im 13/28]
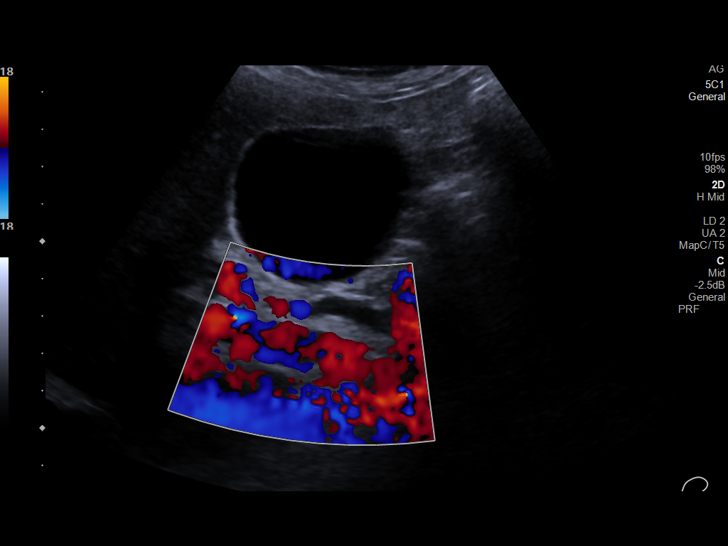
[im 15/28]
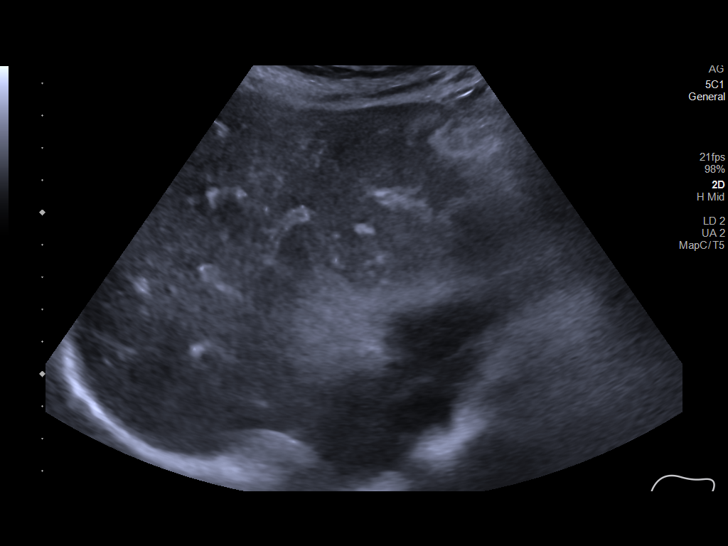
[im 17/28]
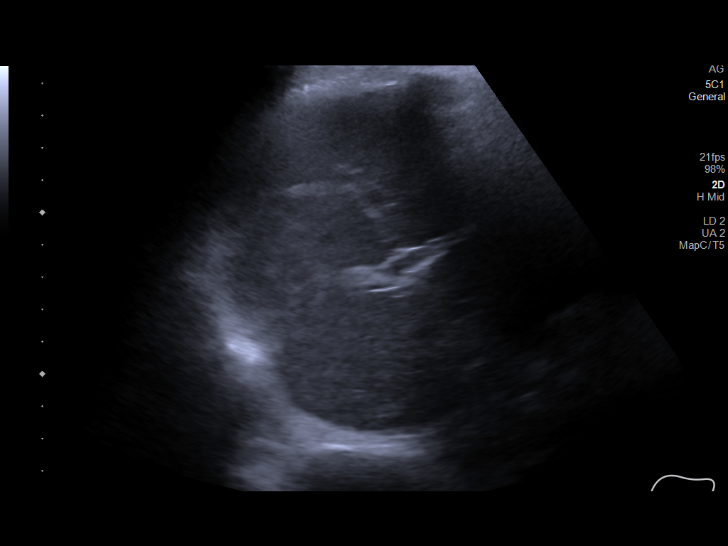
[im 19/28]
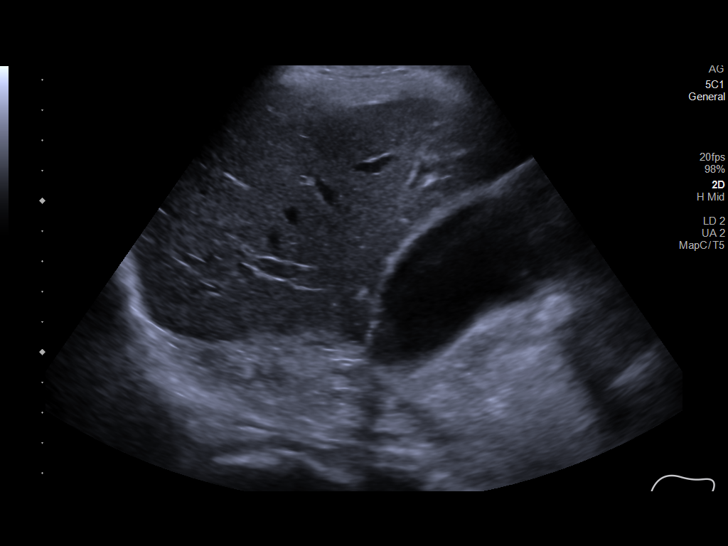
[im 21/28]
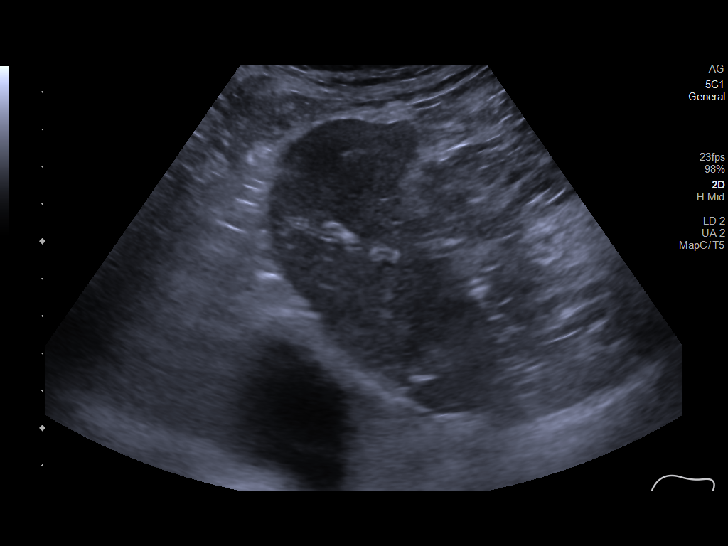
[im 23/28]
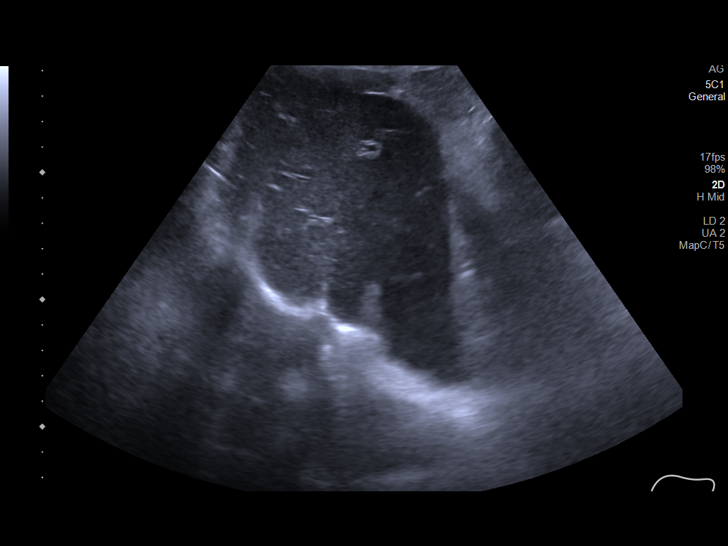
[im 25/28]
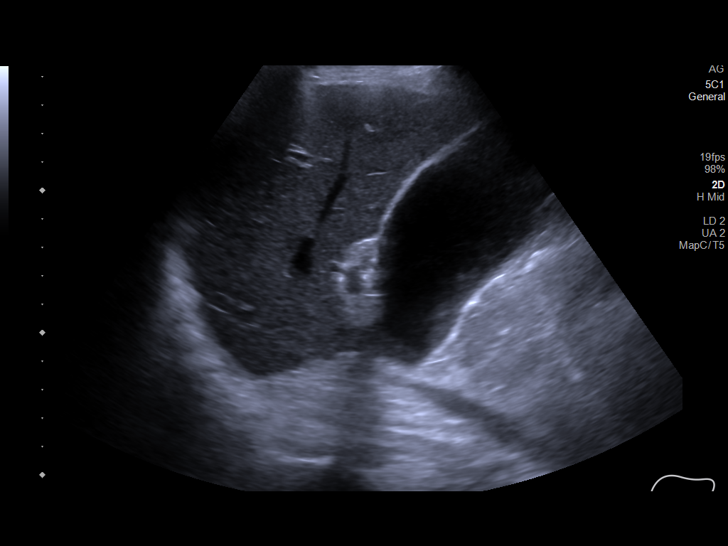
[im 28/28]
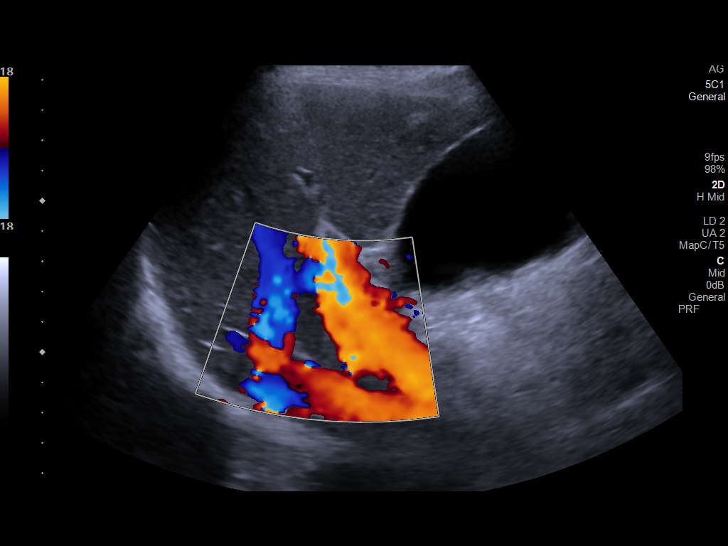

[14 of 25 positions shown; findings below may reference images not displayed]

FINDINGS: Gallbladder:

The gallbladder is distended, measuring up to 10 cm in length. No
evidence of gallstones, gallbladder wall thickening or sonographic
Murphy sign.

Common bile duct:

Diameter: 6 mm

Liver:

No focal lesion identified. Within normal limits in parenchymal
echogenicity. Portal vein is patent on color Doppler imaging with
normal direction of blood flow towards the liver.

Other: None.
IMPRESSION: 1. Gallbladder hydrops could indicate chronic obstruction of the
cystic duct.
2. No signs of acute cholecystitis, cholelithiasis or biliary
dilatation.

## 2022-03-25 ENCOUNTER — Telehealth: Payer: Self-pay | Admitting: Internal Medicine

## 2022-03-25 NOTE — Telephone Encounter (Signed)
Raymond Grant returned your call, please advise.  ?

## 2022-03-25 NOTE — Telephone Encounter (Signed)
Unfortunately, there are no reasonable alternatives to evaluate iron deficiency anemia and various GI complaints that he has (such as dysphagia and weight loss).  If he does not want to have the procedures, there is always the possibility that cancer, or other problems, could be missed.  I did discuss this in great detail with the patient's son Cristie Hem on the day of his office visit. ?Please let me know what they decide.  Thanks ?

## 2022-03-25 NOTE — Telephone Encounter (Signed)
Returned call to [place]. We have reviewed Dr. Blanch Media recommendations. Watt Climes states that she will discuss with pt again, she states that she thinks he will want to proceed after speaking with her. I told Watt Climes to call back if he changes his mind. Madeline verbalized understanding and had no concerns at the end of the call. ?

## 2022-03-25 NOTE — Telephone Encounter (Signed)
Patient daughter Raymond Grant) called regarding the scheduled endoscopy/colonoscopy for 04/05/22. Per daughter, patient is having second thoughts about having both procedures done due to his age. Patient wants to know if there is any other alternatives we may suggest. Please advise.  ?

## 2022-03-25 NOTE — Telephone Encounter (Signed)
Lm on vm for Raymond Grant to return call. ?

## 2022-03-30 NOTE — Telephone Encounter (Signed)
Appts cancelled for procedures. ?

## 2022-03-30 NOTE — Telephone Encounter (Signed)
Unfortunately, stool studies add no value ?

## 2022-03-30 NOTE — Telephone Encounter (Signed)
Patients daughter calling to speak with a nurse regarding the patient giving her some push back on proceeding with procedures. ?

## 2022-03-30 NOTE — Telephone Encounter (Signed)
Daughter aware.

## 2022-03-30 NOTE — Telephone Encounter (Signed)
Pts daughter states her father does not want to have the endo/colon done. He states he feels fine. She and her brothers have tried to talk him into having the procedures but he does not want to do them. She wants to know if a stool test could be done for him since he will not do the procedures. Please advise. ?

## 2022-04-05 ENCOUNTER — Encounter: Payer: Medicare Other | Admitting: Internal Medicine

## 2022-04-08 ENCOUNTER — Ambulatory Visit (INDEPENDENT_AMBULATORY_CARE_PROVIDER_SITE_OTHER): Payer: Medicare Other | Admitting: Podiatry

## 2022-04-08 DIAGNOSIS — M722 Plantar fascial fibromatosis: Secondary | ICD-10-CM

## 2022-04-08 NOTE — Progress Notes (Signed)
?Subjective:  ?Patient ID: Raymond Grant, male    DOB: 10-12-1934,  MRN: [id number] ? ?Chief Complaint  ?Patient presents with  ? Plantar Fasciitis  ? ? ?88 y.o. male presents with the above complaint.  Patient presents with complaint of right heel pain.  Patient states is painful to touch is progressive gotten worse.  Hurts with ambulation hurts to take in the for step.  He would like to discuss treatment options for this has been going for 2 weeks painful burning feels like walking on the nail of the foot.  He has not seen anyone else prior to seeing me he is a diabetic. ? ? ?Review of Systems: Negative except as noted in the HPI. Denies N/V/F/Ch. ? ?Past Medical History:  ?Diagnosis Date  ? Anemia   ? Arthritis   ? Chronic headaches   ? Diabetes mellitus without complication ([place])   ? Esophageal cancer ([place]) 2007  ? throat  ? GERD (gastroesophageal reflux disease)   ? HTN (hypertension)   ? Hyperlipidemia   ? Peptic ulcer disease   ? Pneumonia   ? ? ?Current Outpatient Medications:  ?  acetaminophen (TYLENOL) 500 MG tablet, Take 500 mg by mouth every 6 (six) hours as needed for mild pain., Disp: , Rfl:  ?  guaiFENesin (ROBITUSSIN) 100 MG/5ML SOLN, Take 5 mLs by mouth every 4 (four) hours as needed for cough or to loosen phlegm., Disp: , Rfl:  ?  meloxicam (MOBIC) 15 MG tablet, Take 1 tablet (15 mg total) by mouth daily., Disp: 30 tablet, Rfl: 0 ?  methylPREDNISolone (MEDROL DOSEPAK) 4 MG TBPK tablet, Take as directed, Disp: 21 each, Rfl: 0 ?  nitroGLYCERIN (NITROSTAT) 0.4 MG SL tablet, Place 1 tablet (0.4 mg total) under the tongue every 5 (five) minutes as needed for chest pain. (Patient not taking: Reported on 02/09/2022), Disp: 30 tablet, Rfl: 0 ?  pantoprazole (PROTONIX) 40 MG tablet, Take 1 tablet (40 mg total) by mouth daily., Disp: 30 tablet, Rfl: 0 ?  simvastatin (ZOCOR) 80 MG tablet, Take 80 mg by mouth at bedtime., Disp: , Rfl:  ?  terazosin (HYTRIN) 2 MG capsule, Take 2 mg by mouth at bedtime.,  Disp: , Rfl:  ?  tiZANidine (ZANAFLEX) 2 MG tablet, Take 1 tablet (2 mg total) by mouth every 8 (eight) hours as needed for muscle spasms., Disp: 21 tablet, Rfl: 0 ? ?Social History  ? ?Tobacco Use  ?Smoking Status Former  ? Types: Cigarettes  ? Quit date: 2003  ? Years since quitting: 20.3  ?Smokeless Tobacco Never  ? ? ?No Known Allergies ?Objective:  ?There were no vitals filed for this visit. ?There is no height or weight on file to calculate BMI. ?Constitutional Well developed. ?Well nourished.  ?Vascular Dorsalis pedis pulses palpable bilaterally. ?Posterior tibial pulses palpable bilaterally. ?Capillary refill normal to all digits.  ?No cyanosis or clubbing noted. ?Pedal hair growth normal.  ?Neurologic Normal speech. ?Oriented to person, place, and time. ?Epicritic sensation to light touch grossly present bilaterally.  ?Dermatologic Nails well groomed and normal in appearance. ?No open wounds. ?No skin lesions.  ?Orthopedic: Normal joint ROM without pain or crepitus bilaterally. ?No visible deformities. ?Tender to palpation at the calcaneal tuber right. ?No pain with calcaneal squeeze right. ?Ankle ROM diminished range of motion right. ?Silfverskiold Test: positive right.  ? ?Radiographs: None ? ?Assessment:  ? ?1. Plantar fasciitis, right   ? ?Plan:  ?Patient was evaluated and treated and all questions answered. ? ?Plantar Fasciitis, right ?-  XR reviewed as above.  ?- Educated on icing and stretching. Instructions given.  ?- Injection delivered to the plantar fascia as below. ?- DME: Plantar fascial brace dispensed to support the medial longitudinal arch of the foot and offload pressure from the heel and prevent arch collapse during weightbearing ?- Pharmacologic management: None ? ?Procedure: Injection Tendon/Ligament ?Location: Right plantar fascia at the glabrous junction; medial approach. ?Skin Prep: alcohol ?Injectate: 0.5 cc 0.5% marcaine plain, 0.5 cc of 1% Lidocaine, 0.5 cc kenalog 10. ?Disposition:  Patient tolerated procedure well. Injection site dressed with a band-aid. ? ?No follow-ups on file. ?

## 2022-04-12 ENCOUNTER — Other Ambulatory Visit: Payer: Self-pay | Admitting: Podiatry

## 2022-04-12 ENCOUNTER — Telehealth: Payer: Self-pay | Admitting: *Deleted

## 2022-04-12 MED ORDER — MELOXICAM 15 MG PO TABS: 15.0000 mg | ORAL_TABLET | Freq: Every day | ORAL | 0 refills | Status: AC

## 2022-04-12 MED ORDER — METHYLPREDNISOLONE 4 MG PO TBPK: ORAL_TABLET | ORAL | 0 refills | Status: AC

## 2022-04-12 NOTE — Telephone Encounter (Signed)
Patient's daughter  is calling because he is still in severe pain,losing a lot of sleep. Is there something else that can be done to help alleviate the pain. Please advise. ?

## 2022-04-13 ENCOUNTER — Ambulatory Visit: Payer: Medicare Other | Admitting: Podiatry

## 2022-05-04 ENCOUNTER — Ambulatory Visit: Payer: Medicare Other | Admitting: Podiatry

## 2022-08-23 DIAGNOSIS — K7689 Other specified diseases of liver: Secondary | ICD-10-CM | POA: Diagnosis not present

## 2022-08-23 DIAGNOSIS — Z87891 Personal history of nicotine dependence: Secondary | ICD-10-CM | POA: Diagnosis not present

## 2022-08-23 DIAGNOSIS — K769 Liver disease, unspecified: Secondary | ICD-10-CM | POA: Diagnosis not present

## 2022-08-23 DIAGNOSIS — C329 Malignant neoplasm of larynx, unspecified: Secondary | ICD-10-CM | POA: Diagnosis not present

## 2022-08-23 DIAGNOSIS — I2510 Atherosclerotic heart disease of native coronary artery without angina pectoris: Secondary | ICD-10-CM | POA: Diagnosis not present

## 2022-08-23 DIAGNOSIS — R911 Solitary pulmonary nodule: Secondary | ICD-10-CM | POA: Diagnosis not present

## 2022-08-23 DIAGNOSIS — R918 Other nonspecific abnormal finding of lung field: Secondary | ICD-10-CM | POA: Diagnosis not present

## 2022-09-05 DIAGNOSIS — K769 Liver disease, unspecified: Secondary | ICD-10-CM | POA: Diagnosis not present

## 2022-09-05 DIAGNOSIS — K7689 Other specified diseases of liver: Secondary | ICD-10-CM | POA: Diagnosis not present

## 2022-09-05 DIAGNOSIS — R911 Solitary pulmonary nodule: Secondary | ICD-10-CM | POA: Diagnosis not present

## 2022-09-12 DIAGNOSIS — K769 Liver disease, unspecified: Secondary | ICD-10-CM | POA: Diagnosis not present

## 2022-09-12 DIAGNOSIS — C220 Liver cell carcinoma: Secondary | ICD-10-CM | POA: Diagnosis not present

## 2022-09-12 DIAGNOSIS — C787 Secondary malignant neoplasm of liver and intrahepatic bile duct: Secondary | ICD-10-CM | POA: Diagnosis not present

## 2022-09-12 DIAGNOSIS — C801 Malignant (primary) neoplasm, unspecified: Secondary | ICD-10-CM | POA: Diagnosis not present

## 2022-09-30 DIAGNOSIS — D0140 Carcinoma in situ of unspecified part of intestine: Secondary | ICD-10-CM | POA: Diagnosis not present

## 2022-09-30 DIAGNOSIS — K219 Gastro-esophageal reflux disease without esophagitis: Secondary | ICD-10-CM | POA: Diagnosis not present

## 2022-09-30 DIAGNOSIS — C801 Malignant (primary) neoplasm, unspecified: Secondary | ICD-10-CM | POA: Diagnosis not present

## 2022-09-30 DIAGNOSIS — K7689 Other specified diseases of liver: Secondary | ICD-10-CM | POA: Diagnosis not present

## 2022-09-30 DIAGNOSIS — C7880 Secondary malignant neoplasm of unspecified digestive organ: Secondary | ICD-10-CM | POA: Diagnosis not present

## 2022-09-30 DIAGNOSIS — D649 Anemia, unspecified: Secondary | ICD-10-CM | POA: Diagnosis not present

## 2022-09-30 DIAGNOSIS — R918 Other nonspecific abnormal finding of lung field: Secondary | ICD-10-CM | POA: Diagnosis not present

## 2022-10-14 DIAGNOSIS — Z8521 Personal history of malignant neoplasm of larynx: Secondary | ICD-10-CM | POA: Diagnosis not present

## 2022-10-14 DIAGNOSIS — R0789 Other chest pain: Secondary | ICD-10-CM | POA: Diagnosis not present

## 2022-10-14 DIAGNOSIS — I110 Hypertensive heart disease with heart failure: Secondary | ICD-10-CM | POA: Diagnosis present

## 2022-10-14 DIAGNOSIS — Z8711 Personal history of peptic ulcer disease: Secondary | ICD-10-CM | POA: Diagnosis not present

## 2022-10-14 DIAGNOSIS — K219 Gastro-esophageal reflux disease without esophagitis: Secondary | ICD-10-CM | POA: Diagnosis present

## 2022-10-14 DIAGNOSIS — N179 Acute kidney failure, unspecified: Secondary | ICD-10-CM | POA: Diagnosis not present

## 2022-10-14 DIAGNOSIS — I951 Orthostatic hypotension: Secondary | ICD-10-CM | POA: Diagnosis not present

## 2022-10-14 DIAGNOSIS — Z9221 Personal history of antineoplastic chemotherapy: Secondary | ICD-10-CM | POA: Diagnosis not present

## 2022-10-14 DIAGNOSIS — R945 Abnormal results of liver function studies: Secondary | ICD-10-CM | POA: Diagnosis not present

## 2022-10-14 DIAGNOSIS — Z9889 Other specified postprocedural states: Secondary | ICD-10-CM | POA: Diagnosis not present

## 2022-10-14 DIAGNOSIS — E119 Type 2 diabetes mellitus without complications: Secondary | ICD-10-CM | POA: Diagnosis present

## 2022-10-14 DIAGNOSIS — N400 Enlarged prostate without lower urinary tract symptoms: Secondary | ICD-10-CM | POA: Diagnosis not present

## 2022-10-14 DIAGNOSIS — C227 Other specified carcinomas of liver: Secondary | ICD-10-CM | POA: Diagnosis not present

## 2022-10-14 DIAGNOSIS — K769 Liver disease, unspecified: Secondary | ICD-10-CM | POA: Diagnosis not present

## 2022-10-14 DIAGNOSIS — I700 Atherosclerosis of aorta: Secondary | ICD-10-CM | POA: Diagnosis not present

## 2022-10-14 DIAGNOSIS — J9811 Atelectasis: Secondary | ICD-10-CM | POA: Diagnosis not present

## 2022-10-14 DIAGNOSIS — R918 Other nonspecific abnormal finding of lung field: Secondary | ICD-10-CM | POA: Diagnosis not present

## 2022-10-14 DIAGNOSIS — R0602 Shortness of breath: Secondary | ICD-10-CM | POA: Diagnosis not present

## 2022-10-14 DIAGNOSIS — I5033 Acute on chronic diastolic (congestive) heart failure: Secondary | ICD-10-CM | POA: Diagnosis not present

## 2022-10-14 DIAGNOSIS — R1312 Dysphagia, oropharyngeal phase: Secondary | ICD-10-CM | POA: Diagnosis not present

## 2022-10-14 DIAGNOSIS — C801 Malignant (primary) neoplasm, unspecified: Secondary | ICD-10-CM | POA: Diagnosis not present

## 2022-10-14 DIAGNOSIS — Z79899 Other long term (current) drug therapy: Secondary | ICD-10-CM | POA: Diagnosis not present

## 2022-10-14 DIAGNOSIS — I350 Nonrheumatic aortic (valve) stenosis: Secondary | ICD-10-CM | POA: Diagnosis present

## 2022-10-14 DIAGNOSIS — R609 Edema, unspecified: Secondary | ICD-10-CM | POA: Diagnosis not present

## 2022-10-14 DIAGNOSIS — R1314 Dysphagia, pharyngoesophageal phase: Secondary | ICD-10-CM | POA: Diagnosis not present

## 2022-10-14 DIAGNOSIS — E861 Hypovolemia: Secondary | ICD-10-CM | POA: Diagnosis not present

## 2022-10-14 DIAGNOSIS — K279 Peptic ulcer, site unspecified, unspecified as acute or chronic, without hemorrhage or perforation: Secondary | ICD-10-CM | POA: Diagnosis not present

## 2022-10-14 DIAGNOSIS — K319 Disease of stomach and duodenum, unspecified: Secondary | ICD-10-CM | POA: Diagnosis present

## 2022-10-14 DIAGNOSIS — Z87891 Personal history of nicotine dependence: Secondary | ICD-10-CM | POA: Diagnosis not present

## 2022-10-14 DIAGNOSIS — M7989 Other specified soft tissue disorders: Secondary | ICD-10-CM | POA: Diagnosis not present

## 2022-10-14 DIAGNOSIS — J9601 Acute respiratory failure with hypoxia: Secondary | ICD-10-CM | POA: Diagnosis not present

## 2022-10-14 DIAGNOSIS — E872 Acidosis, unspecified: Secondary | ICD-10-CM | POA: Diagnosis present

## 2022-10-14 DIAGNOSIS — E8729 Other acidosis: Secondary | ICD-10-CM | POA: Diagnosis not present

## 2022-10-14 DIAGNOSIS — J690 Pneumonitis due to inhalation of food and vomit: Secondary | ICD-10-CM | POA: Diagnosis not present

## 2022-10-14 DIAGNOSIS — C7880 Secondary malignant neoplasm of unspecified digestive organ: Secondary | ICD-10-CM | POA: Diagnosis not present

## 2022-10-14 DIAGNOSIS — I517 Cardiomegaly: Secondary | ICD-10-CM | POA: Diagnosis not present

## 2022-10-14 DIAGNOSIS — I2510 Atherosclerotic heart disease of native coronary artery without angina pectoris: Secondary | ICD-10-CM | POA: Diagnosis present

## 2022-10-14 DIAGNOSIS — I309 Acute pericarditis, unspecified: Secondary | ICD-10-CM | POA: Diagnosis not present

## 2022-10-14 DIAGNOSIS — Z923 Personal history of irradiation: Secondary | ICD-10-CM | POA: Diagnosis not present

## 2022-10-14 DIAGNOSIS — R17 Unspecified jaundice: Secondary | ICD-10-CM | POA: Diagnosis not present

## 2022-10-14 DIAGNOSIS — C787 Secondary malignant neoplasm of liver and intrahepatic bile duct: Secondary | ICD-10-CM | POA: Diagnosis present

## 2022-10-14 DIAGNOSIS — I6502 Occlusion and stenosis of left vertebral artery: Secondary | ICD-10-CM | POA: Diagnosis not present

## 2022-10-14 DIAGNOSIS — E785 Hyperlipidemia, unspecified: Secondary | ICD-10-CM | POA: Diagnosis present

## 2022-10-14 DIAGNOSIS — I10 Essential (primary) hypertension: Secondary | ICD-10-CM | POA: Diagnosis not present

## 2022-10-14 DIAGNOSIS — J90 Pleural effusion, not elsewhere classified: Secondary | ICD-10-CM | POA: Diagnosis not present

## 2022-10-14 DIAGNOSIS — D509 Iron deficiency anemia, unspecified: Secondary | ICD-10-CM | POA: Diagnosis not present

## 2022-10-15 DIAGNOSIS — Z79899 Other long term (current) drug therapy: Secondary | ICD-10-CM | POA: Diagnosis not present

## 2022-10-15 DIAGNOSIS — R945 Abnormal results of liver function studies: Secondary | ICD-10-CM | POA: Diagnosis not present

## 2022-10-15 DIAGNOSIS — E785 Hyperlipidemia, unspecified: Secondary | ICD-10-CM | POA: Diagnosis present

## 2022-10-15 DIAGNOSIS — I309 Acute pericarditis, unspecified: Secondary | ICD-10-CM | POA: Diagnosis present

## 2022-10-15 DIAGNOSIS — Z87891 Personal history of nicotine dependence: Secondary | ICD-10-CM | POA: Diagnosis not present

## 2022-10-15 DIAGNOSIS — I2510 Atherosclerotic heart disease of native coronary artery without angina pectoris: Secondary | ICD-10-CM | POA: Diagnosis present

## 2022-10-15 DIAGNOSIS — R1312 Dysphagia, oropharyngeal phase: Secondary | ICD-10-CM | POA: Diagnosis not present

## 2022-10-15 DIAGNOSIS — K319 Disease of stomach and duodenum, unspecified: Secondary | ICD-10-CM | POA: Diagnosis present

## 2022-10-15 DIAGNOSIS — R0789 Other chest pain: Secondary | ICD-10-CM | POA: Diagnosis not present

## 2022-10-15 DIAGNOSIS — I5033 Acute on chronic diastolic (congestive) heart failure: Secondary | ICD-10-CM | POA: Diagnosis not present

## 2022-10-15 DIAGNOSIS — J9601 Acute respiratory failure with hypoxia: Secondary | ICD-10-CM | POA: Diagnosis not present

## 2022-10-15 DIAGNOSIS — I350 Nonrheumatic aortic (valve) stenosis: Secondary | ICD-10-CM | POA: Diagnosis present

## 2022-10-15 DIAGNOSIS — J690 Pneumonitis due to inhalation of food and vomit: Secondary | ICD-10-CM | POA: Diagnosis not present

## 2022-10-15 DIAGNOSIS — K219 Gastro-esophageal reflux disease without esophagitis: Secondary | ICD-10-CM | POA: Diagnosis present

## 2022-10-15 DIAGNOSIS — I517 Cardiomegaly: Secondary | ICD-10-CM | POA: Diagnosis not present

## 2022-10-15 DIAGNOSIS — J9811 Atelectasis: Secondary | ICD-10-CM | POA: Diagnosis not present

## 2022-10-15 DIAGNOSIS — R609 Edema, unspecified: Secondary | ICD-10-CM | POA: Diagnosis not present

## 2022-10-15 DIAGNOSIS — I10 Essential (primary) hypertension: Secondary | ICD-10-CM | POA: Diagnosis not present

## 2022-10-15 DIAGNOSIS — N179 Acute kidney failure, unspecified: Secondary | ICD-10-CM | POA: Diagnosis not present

## 2022-10-15 DIAGNOSIS — I700 Atherosclerosis of aorta: Secondary | ICD-10-CM | POA: Diagnosis not present

## 2022-10-15 DIAGNOSIS — I951 Orthostatic hypotension: Secondary | ICD-10-CM | POA: Diagnosis not present

## 2022-10-15 DIAGNOSIS — I110 Hypertensive heart disease with heart failure: Secondary | ICD-10-CM | POA: Diagnosis present

## 2022-10-15 DIAGNOSIS — E872 Acidosis, unspecified: Secondary | ICD-10-CM | POA: Diagnosis present

## 2022-10-15 DIAGNOSIS — J90 Pleural effusion, not elsewhere classified: Secondary | ICD-10-CM | POA: Diagnosis not present

## 2022-10-15 DIAGNOSIS — E119 Type 2 diabetes mellitus without complications: Secondary | ICD-10-CM | POA: Diagnosis present

## 2022-10-15 DIAGNOSIS — N400 Enlarged prostate without lower urinary tract symptoms: Secondary | ICD-10-CM | POA: Diagnosis present

## 2022-10-15 DIAGNOSIS — R17 Unspecified jaundice: Secondary | ICD-10-CM | POA: Diagnosis not present

## 2022-10-15 DIAGNOSIS — Z923 Personal history of irradiation: Secondary | ICD-10-CM | POA: Diagnosis not present

## 2022-10-15 DIAGNOSIS — M7989 Other specified soft tissue disorders: Secondary | ICD-10-CM | POA: Diagnosis not present

## 2022-10-15 DIAGNOSIS — K279 Peptic ulcer, site unspecified, unspecified as acute or chronic, without hemorrhage or perforation: Secondary | ICD-10-CM | POA: Diagnosis not present

## 2022-10-15 DIAGNOSIS — Z9221 Personal history of antineoplastic chemotherapy: Secondary | ICD-10-CM | POA: Diagnosis not present

## 2022-10-15 DIAGNOSIS — E861 Hypovolemia: Secondary | ICD-10-CM | POA: Diagnosis not present

## 2022-10-15 DIAGNOSIS — D509 Iron deficiency anemia, unspecified: Secondary | ICD-10-CM | POA: Diagnosis present

## 2022-10-15 DIAGNOSIS — C801 Malignant (primary) neoplasm, unspecified: Secondary | ICD-10-CM | POA: Diagnosis present

## 2022-10-15 DIAGNOSIS — Z8711 Personal history of peptic ulcer disease: Secondary | ICD-10-CM | POA: Diagnosis not present

## 2022-10-15 DIAGNOSIS — C227 Other specified carcinomas of liver: Secondary | ICD-10-CM | POA: Diagnosis not present

## 2022-10-15 DIAGNOSIS — K769 Liver disease, unspecified: Secondary | ICD-10-CM | POA: Diagnosis not present

## 2022-10-15 DIAGNOSIS — R1314 Dysphagia, pharyngoesophageal phase: Secondary | ICD-10-CM | POA: Diagnosis not present

## 2022-10-15 DIAGNOSIS — I6502 Occlusion and stenosis of left vertebral artery: Secondary | ICD-10-CM | POA: Diagnosis not present

## 2022-10-15 DIAGNOSIS — Z8521 Personal history of malignant neoplasm of larynx: Secondary | ICD-10-CM | POA: Diagnosis not present

## 2022-10-15 DIAGNOSIS — E8729 Other acidosis: Secondary | ICD-10-CM | POA: Diagnosis not present

## 2022-10-15 DIAGNOSIS — R918 Other nonspecific abnormal finding of lung field: Secondary | ICD-10-CM | POA: Diagnosis not present

## 2022-10-15 DIAGNOSIS — Z9889 Other specified postprocedural states: Secondary | ICD-10-CM | POA: Diagnosis not present

## 2022-10-15 DIAGNOSIS — R0602 Shortness of breath: Secondary | ICD-10-CM | POA: Diagnosis not present

## 2022-10-15 DIAGNOSIS — C787 Secondary malignant neoplasm of liver and intrahepatic bile duct: Secondary | ICD-10-CM | POA: Diagnosis present

## 2022-10-21 DIAGNOSIS — R918 Other nonspecific abnormal finding of lung field: Secondary | ICD-10-CM | POA: Diagnosis not present

## 2022-10-23 DIAGNOSIS — C801 Malignant (primary) neoplasm, unspecified: Secondary | ICD-10-CM | POA: Diagnosis not present

## 2022-10-28 DIAGNOSIS — I491 Atrial premature depolarization: Secondary | ICD-10-CM | POA: Diagnosis not present

## 2022-10-28 DIAGNOSIS — R55 Syncope and collapse: Secondary | ICD-10-CM | POA: Diagnosis not present

## 2022-10-28 DIAGNOSIS — E785 Hyperlipidemia, unspecified: Secondary | ICD-10-CM | POA: Diagnosis present

## 2022-10-28 DIAGNOSIS — Z20822 Contact with and (suspected) exposure to covid-19: Secondary | ICD-10-CM | POA: Diagnosis not present

## 2022-10-28 DIAGNOSIS — C229 Malignant neoplasm of liver, not specified as primary or secondary: Secondary | ICD-10-CM | POA: Diagnosis not present

## 2022-10-28 DIAGNOSIS — D75829 Heparin-induced thrombocytopenia, unspecified: Secondary | ICD-10-CM | POA: Diagnosis not present

## 2022-10-28 DIAGNOSIS — K7290 Hepatic failure, unspecified without coma: Secondary | ICD-10-CM | POA: Diagnosis present

## 2022-10-28 DIAGNOSIS — N183 Chronic kidney disease, stage 3 unspecified: Secondary | ICD-10-CM | POA: Diagnosis present

## 2022-10-28 DIAGNOSIS — I471 Supraventricular tachycardia, unspecified: Secondary | ICD-10-CM | POA: Diagnosis present

## 2022-10-28 DIAGNOSIS — I469 Cardiac arrest, cause unspecified: Secondary | ICD-10-CM | POA: Diagnosis not present

## 2022-10-28 DIAGNOSIS — R519 Headache, unspecified: Secondary | ICD-10-CM | POA: Diagnosis not present

## 2022-10-28 DIAGNOSIS — Z66 Do not resuscitate: Secondary | ICD-10-CM | POA: Diagnosis not present

## 2022-10-28 DIAGNOSIS — L539 Erythematous condition, unspecified: Secondary | ICD-10-CM | POA: Diagnosis not present

## 2022-10-28 DIAGNOSIS — E8729 Other acidosis: Secondary | ICD-10-CM | POA: Diagnosis not present

## 2022-10-28 DIAGNOSIS — Z87891 Personal history of nicotine dependence: Secondary | ICD-10-CM | POA: Diagnosis not present

## 2022-10-28 DIAGNOSIS — R531 Weakness: Secondary | ICD-10-CM | POA: Diagnosis not present

## 2022-10-28 DIAGNOSIS — M7989 Other specified soft tissue disorders: Secondary | ICD-10-CM | POA: Diagnosis not present

## 2022-10-28 DIAGNOSIS — Z8521 Personal history of malignant neoplasm of larynx: Secondary | ICD-10-CM | POA: Diagnosis not present

## 2022-10-28 DIAGNOSIS — N400 Enlarged prostate without lower urinary tract symptoms: Secondary | ICD-10-CM | POA: Diagnosis not present

## 2022-10-28 DIAGNOSIS — J986 Disorders of diaphragm: Secondary | ICD-10-CM | POA: Diagnosis not present

## 2022-10-28 DIAGNOSIS — I129 Hypertensive chronic kidney disease with stage 1 through stage 4 chronic kidney disease, or unspecified chronic kidney disease: Secondary | ICD-10-CM | POA: Diagnosis present

## 2022-10-28 DIAGNOSIS — J90 Pleural effusion, not elsewhere classified: Secondary | ICD-10-CM | POA: Diagnosis not present

## 2022-10-28 DIAGNOSIS — E1122 Type 2 diabetes mellitus with diabetic chronic kidney disease: Secondary | ICD-10-CM | POA: Diagnosis present

## 2022-10-28 DIAGNOSIS — J9601 Acute respiratory failure with hypoxia: Secondary | ICD-10-CM | POA: Diagnosis not present

## 2022-10-28 DIAGNOSIS — E119 Type 2 diabetes mellitus without complications: Secondary | ICD-10-CM | POA: Diagnosis not present

## 2022-10-28 DIAGNOSIS — K831 Obstruction of bile duct: Secondary | ICD-10-CM | POA: Diagnosis present

## 2022-10-28 DIAGNOSIS — K8309 Other cholangitis: Secondary | ICD-10-CM | POA: Diagnosis present

## 2022-10-28 DIAGNOSIS — N179 Acute kidney failure, unspecified: Secondary | ICD-10-CM | POA: Diagnosis not present

## 2022-10-28 DIAGNOSIS — D684 Acquired coagulation factor deficiency: Secondary | ICD-10-CM | POA: Diagnosis present

## 2022-10-28 DIAGNOSIS — C787 Secondary malignant neoplasm of liver and intrahepatic bile duct: Secondary | ICD-10-CM | POA: Diagnosis present

## 2022-10-28 DIAGNOSIS — E8770 Fluid overload, unspecified: Secondary | ICD-10-CM | POA: Diagnosis not present

## 2022-10-28 DIAGNOSIS — I82411 Acute embolism and thrombosis of right femoral vein: Secondary | ICD-10-CM | POA: Diagnosis present

## 2022-10-28 DIAGNOSIS — E878 Other disorders of electrolyte and fluid balance, not elsewhere classified: Secondary | ICD-10-CM | POA: Diagnosis present

## 2022-10-28 DIAGNOSIS — R0602 Shortness of breath: Secondary | ICD-10-CM | POA: Diagnosis not present

## 2022-10-28 DIAGNOSIS — R918 Other nonspecific abnormal finding of lung field: Secondary | ICD-10-CM | POA: Diagnosis not present

## 2022-10-28 DIAGNOSIS — E872 Acidosis, unspecified: Secondary | ICD-10-CM | POA: Diagnosis present

## 2022-10-28 DIAGNOSIS — I10 Essential (primary) hypertension: Secondary | ICD-10-CM | POA: Diagnosis not present

## 2022-10-28 DIAGNOSIS — C3411 Malignant neoplasm of upper lobe, right bronchus or lung: Secondary | ICD-10-CM | POA: Diagnosis present

## 2022-10-28 DIAGNOSIS — J9811 Atelectasis: Secondary | ICD-10-CM | POA: Diagnosis not present

## 2022-10-28 DIAGNOSIS — Z515 Encounter for palliative care: Secondary | ICD-10-CM | POA: Diagnosis not present

## 2022-10-28 DIAGNOSIS — K838 Other specified diseases of biliary tract: Secondary | ICD-10-CM | POA: Diagnosis not present

## 2022-10-28 DIAGNOSIS — R17 Unspecified jaundice: Secondary | ICD-10-CM | POA: Diagnosis not present

## 2022-10-28 DIAGNOSIS — I350 Nonrheumatic aortic (valve) stenosis: Secondary | ICD-10-CM | POA: Diagnosis not present

## 2022-10-28 DIAGNOSIS — K8020 Calculus of gallbladder without cholecystitis without obstruction: Secondary | ICD-10-CM | POA: Diagnosis not present

## 2022-10-28 DIAGNOSIS — I951 Orthostatic hypotension: Secondary | ICD-10-CM | POA: Diagnosis present

## 2022-10-28 DIAGNOSIS — Z79899 Other long term (current) drug therapy: Secondary | ICD-10-CM | POA: Diagnosis not present

## 2022-10-28 DIAGNOSIS — Z743 Need for continuous supervision: Secondary | ICD-10-CM | POA: Diagnosis not present

## 2022-10-28 DIAGNOSIS — R69 Illness, unspecified: Secondary | ICD-10-CM | POA: Diagnosis not present

## 2022-10-28 DIAGNOSIS — E871 Hypo-osmolality and hyponatremia: Secondary | ICD-10-CM | POA: Diagnosis present

## 2022-10-28 DIAGNOSIS — D72829 Elevated white blood cell count, unspecified: Secondary | ICD-10-CM | POA: Diagnosis not present

## 2022-10-28 DIAGNOSIS — C3491 Malignant neoplasm of unspecified part of right bronchus or lung: Secondary | ICD-10-CM | POA: Diagnosis not present

## 2022-10-29 DIAGNOSIS — R918 Other nonspecific abnormal finding of lung field: Secondary | ICD-10-CM | POA: Diagnosis not present

## 2022-10-29 DIAGNOSIS — R531 Weakness: Secondary | ICD-10-CM | POA: Diagnosis not present

## 2022-10-29 DIAGNOSIS — E785 Hyperlipidemia, unspecified: Secondary | ICD-10-CM | POA: Diagnosis present

## 2022-10-29 DIAGNOSIS — R17 Unspecified jaundice: Secondary | ICD-10-CM | POA: Diagnosis not present

## 2022-10-29 DIAGNOSIS — E871 Hypo-osmolality and hyponatremia: Secondary | ICD-10-CM | POA: Diagnosis present

## 2022-10-29 DIAGNOSIS — I82411 Acute embolism and thrombosis of right femoral vein: Secondary | ICD-10-CM | POA: Diagnosis present

## 2022-10-29 DIAGNOSIS — E8770 Fluid overload, unspecified: Secondary | ICD-10-CM | POA: Diagnosis not present

## 2022-10-29 DIAGNOSIS — E872 Acidosis, unspecified: Secondary | ICD-10-CM | POA: Diagnosis present

## 2022-10-29 DIAGNOSIS — K7290 Hepatic failure, unspecified without coma: Secondary | ICD-10-CM | POA: Diagnosis present

## 2022-10-29 DIAGNOSIS — J90 Pleural effusion, not elsewhere classified: Secondary | ICD-10-CM | POA: Diagnosis not present

## 2022-10-29 DIAGNOSIS — J9811 Atelectasis: Secondary | ICD-10-CM | POA: Diagnosis not present

## 2022-10-29 DIAGNOSIS — N400 Enlarged prostate without lower urinary tract symptoms: Secondary | ICD-10-CM | POA: Diagnosis not present

## 2022-10-29 DIAGNOSIS — I469 Cardiac arrest, cause unspecified: Secondary | ICD-10-CM | POA: Diagnosis not present

## 2022-10-29 DIAGNOSIS — Z66 Do not resuscitate: Secondary | ICD-10-CM | POA: Diagnosis not present

## 2022-10-29 DIAGNOSIS — N179 Acute kidney failure, unspecified: Secondary | ICD-10-CM | POA: Diagnosis not present

## 2022-10-29 DIAGNOSIS — J9601 Acute respiratory failure with hypoxia: Secondary | ICD-10-CM | POA: Diagnosis not present

## 2022-10-29 DIAGNOSIS — I129 Hypertensive chronic kidney disease with stage 1 through stage 4 chronic kidney disease, or unspecified chronic kidney disease: Secondary | ICD-10-CM | POA: Diagnosis present

## 2022-10-29 DIAGNOSIS — N183 Chronic kidney disease, stage 3 unspecified: Secondary | ICD-10-CM | POA: Diagnosis present

## 2022-10-29 DIAGNOSIS — Z515 Encounter for palliative care: Secondary | ICD-10-CM | POA: Diagnosis not present

## 2022-10-29 DIAGNOSIS — D684 Acquired coagulation factor deficiency: Secondary | ICD-10-CM | POA: Diagnosis present

## 2022-10-29 DIAGNOSIS — C3411 Malignant neoplasm of upper lobe, right bronchus or lung: Secondary | ICD-10-CM | POA: Diagnosis present

## 2022-10-29 DIAGNOSIS — I951 Orthostatic hypotension: Secondary | ICD-10-CM | POA: Diagnosis present

## 2022-10-29 DIAGNOSIS — K831 Obstruction of bile duct: Secondary | ICD-10-CM | POA: Diagnosis present

## 2022-10-29 DIAGNOSIS — E878 Other disorders of electrolyte and fluid balance, not elsewhere classified: Secondary | ICD-10-CM | POA: Diagnosis present

## 2022-10-29 DIAGNOSIS — I350 Nonrheumatic aortic (valve) stenosis: Secondary | ICD-10-CM | POA: Diagnosis present

## 2022-10-29 DIAGNOSIS — K838 Other specified diseases of biliary tract: Secondary | ICD-10-CM | POA: Diagnosis not present

## 2022-10-29 DIAGNOSIS — Z8521 Personal history of malignant neoplasm of larynx: Secondary | ICD-10-CM | POA: Diagnosis not present

## 2022-10-29 DIAGNOSIS — R55 Syncope and collapse: Secondary | ICD-10-CM | POA: Diagnosis not present

## 2022-10-29 DIAGNOSIS — C787 Secondary malignant neoplasm of liver and intrahepatic bile duct: Secondary | ICD-10-CM | POA: Diagnosis present

## 2022-10-29 DIAGNOSIS — D75829 Heparin-induced thrombocytopenia, unspecified: Secondary | ICD-10-CM | POA: Diagnosis not present

## 2022-10-29 DIAGNOSIS — E1122 Type 2 diabetes mellitus with diabetic chronic kidney disease: Secondary | ICD-10-CM | POA: Diagnosis present

## 2022-10-29 DIAGNOSIS — K8020 Calculus of gallbladder without cholecystitis without obstruction: Secondary | ICD-10-CM | POA: Diagnosis not present

## 2022-10-29 DIAGNOSIS — K8309 Other cholangitis: Secondary | ICD-10-CM | POA: Diagnosis present

## 2022-10-29 DIAGNOSIS — C3491 Malignant neoplasm of unspecified part of right bronchus or lung: Secondary | ICD-10-CM | POA: Diagnosis not present

## 2022-10-29 DIAGNOSIS — E119 Type 2 diabetes mellitus without complications: Secondary | ICD-10-CM | POA: Diagnosis not present

## 2022-10-29 DIAGNOSIS — I471 Supraventricular tachycardia, unspecified: Secondary | ICD-10-CM | POA: Diagnosis present

## 2022-10-29 DIAGNOSIS — R0602 Shortness of breath: Secondary | ICD-10-CM | POA: Diagnosis not present

## 2022-11-25 DEATH — deceased
